# Patient Record
Sex: Male | Born: 1980 | Race: Black or African American | Hispanic: No | Marital: Single | Smoking: Never smoker
Health system: Southern US, Community
[De-identification: ages and names within clinical notes are randomized; demographics above are authoritative.]

---

## 2021-04-05 ENCOUNTER — Other Ambulatory Visit: Payer: Self-pay

## 2021-04-05 ENCOUNTER — Inpatient Hospital Stay (HOSPITAL_COMMUNITY)
Admission: EM | Admit: 2021-04-05 | Discharge: 2021-04-11 | DRG: 872 | Disposition: A | Discharge status: Home / Self Care | Payer: Medicaid Other | Attending: Internal Medicine | Admitting: Internal Medicine

## 2021-04-05 ENCOUNTER — Emergency Department (HOSPITAL_COMMUNITY): Payer: Medicaid Other

## 2021-04-05 DIAGNOSIS — R1310 Dysphagia, unspecified: Secondary | ICD-10-CM | POA: Diagnosis present

## 2021-04-05 DIAGNOSIS — D509 Iron deficiency anemia, unspecified: Secondary | ICD-10-CM | POA: Diagnosis present

## 2021-04-05 DIAGNOSIS — R221 Localized swelling, mass and lump, neck: Secondary | ICD-10-CM | POA: Diagnosis not present

## 2021-04-05 DIAGNOSIS — F1010 Alcohol abuse, uncomplicated: Secondary | ICD-10-CM | POA: Diagnosis present

## 2021-04-05 DIAGNOSIS — E871 Hypo-osmolality and hyponatremia: Secondary | ICD-10-CM | POA: Diagnosis present

## 2021-04-05 DIAGNOSIS — L040 Acute lymphadenitis of face, head and neck: Secondary | ICD-10-CM | POA: Diagnosis present

## 2021-04-05 DIAGNOSIS — L0211 Cutaneous abscess of neck: Secondary | ICD-10-CM | POA: Diagnosis present

## 2021-04-05 DIAGNOSIS — K2970 Gastritis, unspecified, without bleeding: Secondary | ICD-10-CM | POA: Diagnosis present

## 2021-04-05 DIAGNOSIS — A419 Sepsis, unspecified organism: Secondary | ICD-10-CM | POA: Diagnosis present

## 2021-04-05 DIAGNOSIS — D75838 Other thrombocytosis: Secondary | ICD-10-CM | POA: Diagnosis present

## 2021-04-05 DIAGNOSIS — Z20822 Contact with and (suspected) exposure to covid-19: Secondary | ICD-10-CM | POA: Diagnosis present

## 2021-04-05 DIAGNOSIS — R609 Edema, unspecified: Secondary | ICD-10-CM | POA: Diagnosis not present

## 2021-04-05 LAB — HIV ANTIBODY (ROUTINE TESTING W REFLEX): HIV Screen 4th Generation wRfx: NONREACTIVE

## 2021-04-05 LAB — CBC WITH DIFFERENTIAL/PLATELET
Abs Immature Granulocytes: 0 10*3/uL (ref 0.00–0.07)
Basophils Absolute: 0 10*3/uL (ref 0.0–0.1)
Basophils Relative: 0 %
Eosinophils Absolute: 0 10*3/uL (ref 0.0–0.5)
Eosinophils Relative: 0 %
HCT: 34.7 % — ABNORMAL LOW (ref 39.0–52.0)
Hemoglobin: 11.4 g/dL — ABNORMAL LOW (ref 13.0–17.0)
Lymphocytes Relative: 8 %
Lymphs Abs: 1.6 10*3/uL (ref 0.7–4.0)
MCH: 22.9 pg — ABNORMAL LOW (ref 26.0–34.0)
MCHC: 32.9 g/dL (ref 30.0–36.0)
MCV: 69.7 fL — ABNORMAL LOW (ref 80.0–100.0)
Monocytes Absolute: 1.6 10*3/uL — ABNORMAL HIGH (ref 0.1–1.0)
Monocytes Relative: 8 %
Neutro Abs: 16.9 10*3/uL — ABNORMAL HIGH (ref 1.7–7.7)
Neutrophils Relative %: 84 %
Platelets: 396 10*3/uL (ref 150–400)
RBC: 4.98 MIL/uL (ref 4.22–5.81)
RDW: 12.9 % (ref 11.5–15.5)
WBC: 20.1 10*3/uL — ABNORMAL HIGH (ref 4.0–10.5)
nRBC: 0 % (ref 0.0–0.2)
nRBC: 0 /100 WBC

## 2021-04-05 LAB — COMPREHENSIVE METABOLIC PANEL
ALT: 40 U/L (ref 0–44)
AST: 43 U/L — ABNORMAL HIGH (ref 15–41)
Albumin: 3.1 g/dL — ABNORMAL LOW (ref 3.5–5.0)
Alkaline Phosphatase: 63 U/L (ref 38–126)
Anion gap: 11 (ref 5–15)
BUN: 10 mg/dL (ref 6–20)
CO2: 25 mmol/L (ref 22–32)
Calcium: 9.2 mg/dL (ref 8.9–10.3)
Chloride: 93 mmol/L — ABNORMAL LOW (ref 98–111)
Creatinine, Ser: 1.15 mg/dL (ref 0.61–1.24)
GFR, Estimated: >60 mL/min (ref >60)
Glucose, Bld: 111 mg/dL — ABNORMAL HIGH (ref 70–99)
Potassium: 4.3 mmol/L (ref 3.5–5.1)
Sodium: 129 mmol/L — ABNORMAL LOW (ref 135–145)
Total Bilirubin: 0.8 mg/dL (ref 0.3–1.2)
Total Protein: 7.8 g/dL (ref 6.5–8.1)

## 2021-04-05 LAB — FOLATE: Folate: 12.6 ng/mL (ref >5.9)

## 2021-04-05 LAB — IRON AND TIBC
Iron: 18 ug/dL — ABNORMAL LOW (ref 45–182)
Saturation Ratios: 8 % — ABNORMAL LOW (ref 17.9–39.5)
TIBC: 239 ug/dL — ABNORMAL LOW (ref 250–450)
UIBC: 221 ug/dL

## 2021-04-05 LAB — VITAMIN B12: Vitamin B-12: 415 pg/mL (ref 180–914)

## 2021-04-05 LAB — RESP PANEL BY RT-PCR (FLU A&B, COVID) ARPGX2
Influenza A by PCR: NEGATIVE
Influenza B by PCR: NEGATIVE
SARS Coronavirus 2 by RT PCR: NEGATIVE

## 2021-04-05 LAB — FERRITIN: Ferritin: 581 ng/mL — ABNORMAL HIGH (ref 24–336)

## 2021-04-05 LAB — TSH: TSH: 2.364 u[IU]/mL (ref 0.350–4.500)

## 2021-04-05 LAB — LACTIC ACID, PLASMA: Lactic Acid, Venous: 1.2 mmol/L (ref 0.5–1.9)

## 2021-04-05 MED ORDER — FENTANYL CITRATE PF 50 MCG/ML IJ SOSY
50.0000 ug | PREFILLED_SYRINGE | Freq: Once | INTRAMUSCULAR | Status: AC
  Administered 2021-04-05: 50 ug via INTRAVENOUS
  Filled 2021-04-05: qty 1

## 2021-04-05 MED ORDER — IBUPROFEN 400 MG PO TABS: 400.0000 mg | ORAL_TABLET | Freq: Four times a day (QID) | ORAL | Status: DC | PRN

## 2021-04-05 MED ORDER — VANCOMYCIN HCL 1750 MG/350ML IV SOLN
1750.0000 mg | Freq: Once | INTRAVENOUS | Status: AC
  Administered 2021-04-05: 1750 mg via INTRAVENOUS
  Filled 2021-04-05: qty 350

## 2021-04-05 MED ORDER — LORAZEPAM 2 MG/ML IJ SOLN: 1.0000 mg | INTRAMUSCULAR | Status: AC | PRN

## 2021-04-05 MED ORDER — ADULT MULTIVITAMIN W/MINERALS CH
1.0000 | ORAL_TABLET | Freq: Every day | ORAL | Status: DC
  Administered 2021-04-05 – 2021-04-11 (×6): 1 via ORAL
  Filled 2021-04-05 (×8): qty 1

## 2021-04-05 MED ORDER — FOLIC ACID 1 MG PO TABS
1.0000 mg | ORAL_TABLET | Freq: Every day | ORAL | Status: DC
  Administered 2021-04-05 – 2021-04-11 (×7): 1 mg via ORAL
  Filled 2021-04-05 (×8): qty 1

## 2021-04-05 MED ORDER — ACETAMINOPHEN 325 MG PO TABS
650.0000 mg | ORAL_TABLET | Freq: Four times a day (QID) | ORAL | Status: DC | PRN
  Administered 2021-04-06 – 2021-04-10 (×6): 650 mg via ORAL
  Filled 2021-04-05 (×6): qty 2

## 2021-04-05 MED ORDER — LORAZEPAM 1 MG PO TABS: 1.0000 mg | ORAL_TABLET | ORAL | Status: AC | PRN

## 2021-04-05 MED ORDER — VANCOMYCIN HCL 1250 MG/250ML IV SOLN
1250.0000 mg | Freq: Two times a day (BID) | INTRAVENOUS | Status: DC
  Administered 2021-04-06 – 2021-04-07 (×3): 1250 mg via INTRAVENOUS
  Filled 2021-04-05 (×6): qty 250

## 2021-04-05 MED ORDER — IBUPROFEN 400 MG PO TABS
600.0000 mg | ORAL_TABLET | Freq: Once | ORAL | Status: AC
  Administered 2021-04-05: 600 mg via ORAL
  Filled 2021-04-05: qty 1

## 2021-04-05 MED ORDER — SODIUM CHLORIDE 0.9 % IV SOLN
1.0000 g | INTRAVENOUS | Status: DC
  Administered 2021-04-05 – 2021-04-07 (×3): 1 g via INTRAVENOUS
  Filled 2021-04-05 (×3): qty 10

## 2021-04-05 MED ORDER — THIAMINE HCL 100 MG PO TABS
100.0000 mg | ORAL_TABLET | Freq: Every day | ORAL | Status: DC
  Administered 2021-04-05 – 2021-04-11 (×7): 100 mg via ORAL
  Filled 2021-04-05 (×8): qty 1

## 2021-04-05 MED ORDER — OXYCODONE HCL 5 MG PO TABS
5.0000 mg | ORAL_TABLET | ORAL | Status: DC | PRN
  Administered 2021-04-06 (×3): 5 mg via ORAL
  Filled 2021-04-05 (×4): qty 1

## 2021-04-05 MED ORDER — IOHEXOL 300 MG/ML  SOLN
75.0000 mL | Freq: Once | INTRAMUSCULAR | Status: AC | PRN
  Administered 2021-04-05: 75 mL via INTRAVENOUS

## 2021-04-05 MED ORDER — LACTATED RINGERS IV SOLN: INTRAVENOUS | Status: DC

## 2021-04-05 NOTE — ED Triage Notes (Signed)
Pt BIB [facility] from home d/t a "fist size hematoma/bump" to the Rt side of his neck. Pt reports it appeared 1 week ago, no issues breathing or swallowing.  130/90 80 bpm 98% RA

## 2021-04-05 NOTE — ED Provider Notes (Signed)
[facility] EMERGENCY DEPARTMENT Provider Note   CSN: [id number] Arrival date & time: 04/05/21  1226     History Chief Complaint  Patient presents with   lump Rt side neck    Caeden Foots is a 40 y.o. male without significant past medical history brought in by EMS to the ED complaining of lump to his right sided neck onset 1 week.  Patient reports that he has been tolerating p.o intake without difficulty.  Patient denies recent injury or trauma.  Patient denies history of diabetes.  Denies recent bug bite.  He has associated subjective fever and redness to the area.  He has not tried any medications for his symptoms.  Patient denies trouble breathing, trouble swallowing, chest pain, abdominal pain, nausea, vomiting, fever, chills, neck pain, neck stiffness, ear pain, or sore throat  The history is provided by the patient. No language interpreter was used.      No past medical history on file.  Patient Active Problem List   Diagnosis Date Noted   Neck abscess 04/05/2021   Sepsis (HCC) 04/05/2021   Hyponatremia 04/05/2021   Microcytic anemia 04/05/2021    No family history on file.     Home Medications Prior to Admission medications   Not on File    Allergies    Patient has no allergy information on record.  Review of Systems   Review of Systems  Constitutional:  Positive for fever. Negative for chills.  HENT:  Negative for congestion, facial swelling, rhinorrhea, sneezing, sore throat and trouble swallowing.   Respiratory:  Negative for cough and shortness of breath.   Cardiovascular:  Negative for chest pain.  Gastrointestinal:  Negative for abdominal pain, nausea and vomiting.  Musculoskeletal:  Negative for neck pain and neck stiffness.  Skin:  Positive for color change. Negative for rash.       + Lump to right sided neck  All other systems reviewed and are negative.  Physical Exam Updated Vital Signs BP 118/62 (BP Location: Right Arm)    Pulse 93   Temp 100.3 F (37.9 C) (Oral)   Resp 18   Ht 6\' 2"  (1.88 m)   Wt 86.2 kg   SpO2 100%   BMI 24.39 kg/m   Physical Exam Vitals and nursing note reviewed.  Constitutional:      General: He is not in acute distress.    Appearance: He is not diaphoretic.  HENT:     Head: Normocephalic and atraumatic.     Jaw: No trismus.     Comments: Able to phonate without difficulty.    Nose: Nose normal.     Mouth/Throat:     Mouth: Mucous membranes are moist.     Pharynx: Oropharynx is clear. Uvula midline. No oropharyngeal exudate.     Tonsils: No tonsillar abscesses.     Comments: Uvula midline.  No oropharynx erythema or exudate. No tonsillar abscess appreciated.  Eyes:     General: No scleral icterus.    Conjunctiva/sclera: Conjunctivae normal.  Neck:     Comments: Large mass noted to right lateral neck with overlying erythema and warmth.  Difficulty with passive range of motion to right side due to lump.  Patient able to turn head to the right about 45 degrees.  Patient able to flex and extend and look to the left side without difficulty.  No appreciated cervical lymphadenopathy.  No cervical spinous tenderness.  See picture below. Cardiovascular:     Rate and Rhythm: Normal  rate and regular rhythm.     Pulses: Normal pulses.     Heart sounds: Normal heart sounds.  Pulmonary:     Effort: Pulmonary effort is normal. No respiratory distress.     Breath sounds: Normal breath sounds. No wheezing.  Abdominal:     General: Bowel sounds are normal.     Palpations: Abdomen is soft. There is no mass.     Tenderness: There is no abdominal tenderness. There is no guarding or rebound.  Musculoskeletal:     Cervical back: Neck supple. Erythema present. No signs of trauma or rigidity. No spinous process tenderness or muscular tenderness. Decreased range of motion.  Lymphadenopathy:     Cervical: No cervical adenopathy.  Skin:    General: Skin is warm and dry.  Neurological:      Mental Status: He is alert.  Psychiatric:        Behavior: Behavior normal.        ED Results / Procedures / Treatments   Labs (all labs ordered are listed, but only abnormal results are displayed) Labs Reviewed  COMPREHENSIVE METABOLIC PANEL - Abnormal; Notable for the following components:      Result Value   Sodium 129 (*)    Chloride 93 (*)    Glucose, Bld 111 (*)    Albumin 3.1 (*)    AST 43 (*)    All other components within normal limits  CBC WITH DIFFERENTIAL/PLATELET - Abnormal; Notable for the following components:   WBC 20.1 (*)    Hemoglobin 11.4 (*)    HCT 34.7 (*)    MCV 69.7 (*)    MCH 22.9 (*)    Neutro Abs 16.9 (*)    Monocytes Absolute 1.6 (*)    All other components within normal limits  RESP PANEL BY RT-PCR (FLU A&B, COVID) ARPGX2  CULTURE, BLOOD (ROUTINE X 2)  CULTURE, BLOOD (ROUTINE X 2)  LACTIC ACID, PLASMA    EKG None  Radiology CT Soft Tissue Neck W Contrast  Result Date: 04/05/2021 CLINICAL DATA:  Right neck mass for 1 week.  Fever and leukocytosis. EXAM: CT NECK WITH CONTRAST TECHNIQUE: Multidetector CT imaging of the neck was performed using the standard protocol following the bolus administration of intravenous contrast. CONTRAST:  63mL OMNIPAQUE IOHEXOL 300 MG/ML  SOLN COMPARISON:  None. FINDINGS: Pharynx and larynx: No mass. Calcification along the anterior aspect of the right palatine tonsil. No retropharyngeal fluid collection. Widely patent airway. Salivary glands: Mild mass effect on the right parotid and right submandibular glands by the large right neck mass/collection described below which is not felt to be of primary salivary origin. Unremarkable appearance of the left submandibular and left parotid glands. Thyroid: Subcentimeter calcification in the left thyroid lobe for which no imaging follow-up is recommended. Lymph nodes: Subcentimeter short axis lymph nodes in the neck bilaterally. Vascular: Nonvisualization of the right internal  jugular vein in the mid and upper neck, either completely effaced or occluded. Limited intracranial: Unremarkable. Visualized orbits: Unremarkable. Mastoids and visualized paranasal sinuses: Minimal mucosal thickening in the maxillary sinuses. Clear mastoid air cells. Skeleton: Scattered dental caries and periapical lucencies. Moderate disc degeneration at C5-6. Upper chest: Clear lung apices. Other: A predominantly low-density mass/collection in the lateral right upper neck with thick, irregular peripheral enhancement measures 5.5 x 5.6 x 7.4 cm. The mass is inseparable from the right sternocleidomastoid muscle, and there is deep extension towards the parapharyngeal space. There is extensive surrounding inflammatory stranding which extends into the lower  neck. IMPRESSION: 7.4 cm right upper neck mass with extensive surrounding inflammation most consistent with an abscess with this clinical history. Necrotic tumor is an alternative consideration. Electronically Signed   By: Sebastian Ache M.D.   On: 04/05/2021 16:26    Procedures Procedures   Medications Ordered in ED Medications  cefTRIAXone (ROCEPHIN) 1 g in sodium chloride 0.9 % 100 mL IVPB (0 g Intravenous Stopped 04/05/21 1516)  vancomycin (VANCOREADY) IVPB 1250 mg/250 mL (has no administration in time range)  ibuprofen (ADVIL) tablet 600 mg (600 mg Oral Given 04/05/21 1430)  vancomycin (VANCOREADY) IVPB 1750 mg/350 mL (0 mg Intravenous Stopped 04/05/21 1645)  iohexol (OMNIPAQUE) 300 MG/ML solution 75 mL (75 mLs Intravenous Contrast Given 04/05/21 1548)  fentaNYL (SUBLIMAZE) injection 50 mcg (50 mcg Intravenous Given 04/05/21 1718)    ED Course  I have reviewed the triage vital signs and the nursing notes.  Pertinent labs & imaging results that were available during my care of the patient were reviewed by me and considered in my medical decision making (see chart for details).  Clinical Course as of 04/05/21 1853  Thu Apr 05, 2021  1256  Attending in to evaluate patient. Bedside US completed by Attending with complex materials noted. Discussed with patient will obtain CT neck and labs, patient agreeable. [SB]  1352 Patient reevaluated.  Patient asleep resting comfortably on stretcher.  Notified patient lab results and informed that we will start IV antibiotics in the ED.  Patient agreeable at this time. [SB]  1413 Repeat vital signs showed elevated temperature to 102.4.  Ibuprofen ordered. Blood cultures and lactate ordered [SB]  1422 Patient asleep on stretcher upon reevaluation.  Patient updated with treatment plan. Patient agreeable. [SB]  1700 Patient temperature improved with Ibuprofen.  [SB]  1700 Patient requesting pain medications, will order. [SB]  4431 Consult with ENT specialist, Dr. Suszanne Conners who recommends admission for IV antibiotics.  He will evaluate patient in the morning.  [SB]  1843 Consult with hospitalist, Dr. Artis Flock who agrees to admit patient at this time. [SB]    Clinical Course User Index [SB] Rashee Marschall A, PA-C   MDM Rules/Calculators/A&P                         Patient presents to the ED with lump to right side of neck x1 week.  Denies recent trauma, injury, bug bite, or history of diabetes.  Upon arrival to the ED, patient tolerating secretions and noted that he drank water while waiting.  No drooling on exam.  Oxygen saturation at 100%, patent airway, no concern for airway compromise at this time. On exam patient with large mass noted to right lateral neck with overlying erythema and warmth.  Difficulty with passive range of motion to right, patient able to turn head to the right about 45 degrees.  Normal flexion and extension and lateral left without difficulty.  No cervical spinous tenderness.  Otherwise no acute cardiovascular, respiratory, abdominal exam findings.  Vital signs stable, patient afebrile, oxygen saturation 100%.  Differential diagnosis includes abscess, lipoma, or brachial cleft cyst.   Vancomycin and Rocephin started in the ED.  Patient given a dose of fentanyl for pain.   CBC notable for a WBC elevated to 20.1 otherwise unremarkable. CMP with sodium 129 otherwise unremarkable.  CT soft tissue neck showed 7.4 cm mass consistent with abscess.  Patient with elevated temperature on recheck to 102.4, ibuprofen given in the ED.  Blood cultures ordered  and pending.  Lactate obtained and normal.  This is likely abscess of right lateral neck.   Consult with ENT, Dr. Suszanne Conners who recommended admission for IV antibiotics.  He will evaluate patient in the morning.  Consult with hospitalist, Dr. Sheppard Penton who agrees to admit patient at this time.  Patient notified and agreeable to admission.  Attending evaluated patient and agrees with admission and treatment plan.  Final Clinical Impression(s) / ED Diagnoses Final diagnoses:  Mass of lateral neck    Rx / DC Orders ED Discharge Orders     None        Joelle Roswell A, PA-C 04/05/21 Lowell Guitar, MD 04/06/21 [phone]

## 2021-04-05 NOTE — H&P (Signed)
History and Physical    Ernest Jenkins GYJ:[id number] DOB: 09-28-1980 DOA: 04/05/2021  PCP: Default, Provider, MD Consultants:  none Patient coming from:  Home - lives with wife and 9 kids.   Chief Complaint: right neck mass   HPI: Ernest Jenkins is a 40 y.o. male with no medical history who presents to ED with right lateral neck mass that started last Friday. He states he started to feel bad and noticed when he turned his head to the right he had pain, but nothing was there. When he woke up on Saturday he had a large mass on the right side of his neck. Since Saturday has continued to get bigger. Warm to touch and red.  No problems breathing or swallowing, but states he can feel food go down. He has had chills, subjective fever. He denies any trauma to the neck, bite, tooth infection. No history of MRSA infection that he is aware of.   He does drink 1 beer (40 ounce) daily. He drinks 8 beers or so a day on the weekend. No history of withdrawals. He does not smoke tobacco. Uses MJ. No other drug use.   Denies any headaches, vision changes, chest pain, palpitations, shortness of breath, cough, stomach pain, N/V/D, leg swelling, dysuria. Poor pO intake over past week.   ED Course: vitals: temp: 100.3, blood pressure 118/62, heart rate 93, respiratory rate 18, oxygen 100% on room air. Pertinent labs: WBC 20.1, hemoglobin 11.4, MCV 69.7, sodium 129, albumin 3.1, AST 43, Actiq acid 1.2, COVID and flu negative. CT soft tissue neck shows predominantly low-density mass collection in the lateral right upper neck with thick, irregular peripheral enhancement measures 5.5 x 5.6 x 7.4 cm.  Mass is inseparable from the right sternocleidomastoid muscle and there is deep extension towards the  parapharyngeal space.  Surrounding inflammatory stranding which extends into the lower neck. In ED patient was started on vancomycin and Rocephin, ENT was consulted and [facility] was asked to admit.  Review of Systems: As per HPI;  otherwise review of systems reviewed and negative.   Ambulatory Status:  Ambulates without assistance   No past medical history  Surgical  history: none     Social History   Socioeconomic History   Marital status: Single    Spouse name: Not on file   Number of children: Not on file   Years of education: Not on file   Highest education level: Not on file  Occupational History   Not on file  Tobacco Use   Smoking status: Not on file   Smokeless tobacco: Not on file  Substance and Sexual Activity   Alcohol use: Not on file   Drug use: Not on file   Sexual activity: Not on file  Other Topics Concern   Not on file  Social History Narrative   Not on file   Social Determinants of Health   Financial Resource Strain: Not on file  Food Insecurity: Not on file  Transportation Needs: Not on file  Physical Activity: Not on file  Stress: Not on file  Social Connections: Not on file  Intimate Partner Violence: Not on file    Not on File  No family history on file.  Prior to Admission medications   Not on File    Physical Exam: Vitals:   04/05/21 1700 04/05/21 1730 04/05/21 1800 04/05/21 1830  BP: 119/69 117/75 116/69 124/68  Pulse: 70 70 69 76  Resp: (!) 27 (!) 21 17 20   Temp:  TempSrc:      SpO2: 100% 97% 98% 100%  Weight:      Height:         General:  Appears calm and comfortable and is in NAD Eyes:  PERRL, EOMI, normal lids, iris ENT:  grossly normal hearing, lips & tongue, mmm; poor dentition with some bad teeth, but no obvious tooth abscess. Right TM not fully visualized due to cerumen.  Neck:  no LAD,  no thyromegaly; no carotid bruits. Large right sided abscess that is warm and indurated.      Cardiovascular:  RRR, no m/r/g. No LE edema.  Respiratory:   CTA bilaterally with no wheezes/rales/rhonchi.  Normal respiratory effort. Abdomen:  soft, NT, ND, NABS Back:   normal alignment, no CVAT Skin:  no rash or induration seen on limited  exam Musculoskeletal:  grossly normal tone BUE/BLE, good ROM, no bony abnormality Lower extremity:  No LE edema.  Limited foot exam with no ulcerations.  2+ distal pulses. Psychiatric:  grossly normal mood and affect, speech fluent and appropriate, AOx3 Neurologic:  CN 2-12 grossly intact, moves all extremities in coordinated fashion, sensation intact    Radiological Exams on Admission: Independently reviewed - see discussion in A/P where applicable  CT Soft Tissue Neck W Contrast  Result Date: 04/05/2021 CLINICAL DATA:  Right neck mass for 1 week.  Fever and leukocytosis. EXAM: CT NECK WITH CONTRAST TECHNIQUE: Multidetector CT imaging of the neck was performed using the standard protocol following the bolus administration of intravenous contrast. CONTRAST:  46mL OMNIPAQUE IOHEXOL 300 MG/ML  SOLN COMPARISON:  None. FINDINGS: Pharynx and larynx: No mass. Calcification along the anterior aspect of the right palatine tonsil. No retropharyngeal fluid collection. Widely patent airway. Salivary glands: Mild mass effect on the right parotid and right submandibular glands by the large right neck mass/collection described below which is not felt to be of primary salivary origin. Unremarkable appearance of the left submandibular and left parotid glands. Thyroid: Subcentimeter calcification in the left thyroid lobe for which no imaging follow-up is recommended. Lymph nodes: Subcentimeter short axis lymph nodes in the neck bilaterally. Vascular: Nonvisualization of the right internal jugular vein in the mid and upper neck, either completely effaced or occluded. Limited intracranial: Unremarkable. Visualized orbits: Unremarkable. Mastoids and visualized paranasal sinuses: Minimal mucosal thickening in the maxillary sinuses. Clear mastoid air cells. Skeleton: Scattered dental caries and periapical lucencies. Moderate disc degeneration at C5-6. Upper chest: Clear lung apices. Other: A predominantly low-density  mass/collection in the lateral right upper neck with thick, irregular peripheral enhancement measures 5.5 x 5.6 x 7.4 cm. The mass is inseparable from the right sternocleidomastoid muscle, and there is deep extension towards the parapharyngeal space. There is extensive surrounding inflammatory stranding which extends into the lower neck. IMPRESSION: 7.4 cm right upper neck mass with extensive surrounding inflammation most consistent with an abscess with this clinical history. Necrotic tumor is an alternative consideration. Electronically Signed   By: Sebastian Ache M.D.   On: 04/05/2021 16:26     Labs on Admission: I have personally reviewed the available labs and imaging studies at the time of the admission.  Pertinent labs:  WBC 20.1,  hemoglobin 11.4,  MCV 69.7,  sodium 129,  albumin 3.1,  AST 43,  lactic acid 1.2, COVID and flu negative.   Assessment/Plan Principal Problem:   Neck abscess with sepsis criteria  -40 year old male presenting with right neck abscess and sepsis criteria with elevated WBC, tachycardia and fever.  -place  in obs with IV antibiotics. Continue vancomycin and rocephin for now -TM not fully visualized and no obvious tooth infection or open wound to skin.  -ENT consulted and will see tomorrow -no obstructing airway and no difficulties with swallowing  -will place SCDs and make NPO at midnight in case of any procedure  -tylenol for fever -IVF  -lactic acid wnl -blood cx pending   Active Problems:     Hyponatremia -? If beer potomania with some hypovolemia aspect as poor PO intake x 1 week.  -check urine studies -gentle IVF overnight  -encouraged to decrease beer intake -follow in AM    Microcytic anemia  -no family hx of sickle cell or other blood disorders -check iron studies   Alcohol abuse No hx of withdrawals Place on CIWA protocol  Start MV/thiamine/folic acid  Encouraged to decrease alcohol intake    Body mass index is 24.39  kg/m.   Level of care: Med-Surg DVT prophylaxis:  SCDs Code Status:  Full - confirmed with patient Family Communication: None present Disposition Plan:  The patient is from: home  Anticipated d/c is to: home   Placed in observation as anticipate less than 2 midnight stay. Needs hospitalization for IV antibiotics, IV fluids and MDM with specialities with possible intervention of neck abscess   Patient is currently: stable  Consults called: ENT  Admission status:  observation   Dragon dictation used in completing this note.    Orland Mustard MD [facility]   How to contact the [facility] Attending or Consulting provider 7A - 7P or covering provider during after hours 7P -7A, for this patient?  Check the care team in [facility] and look for a) attending/consulting [facility] provider listed and b) the [facility] team listed Log into [email or web address] and use [facility]'s universal password to access. If you do not have the password, please contact the hospital operator. Locate the [facility] provider you are looking for under [facility] and page to a number that you can be directly reached. If you still have difficulty reaching the provider, please page the [facility] (Director on Call) for the Hospitalists listed on amion for assistance.   04/05/2021, 6:48 PM

## 2021-04-05 NOTE — ED Notes (Signed)
Provider notified of pt request of pain meds.

## 2021-04-05 NOTE — Progress Notes (Signed)
Pharmacy Antibiotic Note  Ernest Jenkins is a 40 y.o. male admitted on 04/05/2021 with cellulitis.  Pharmacy has been consulted for vancomycin dosing.  Presenting to bump on R side of neck - WBC 20, Tmax 102. Scr 1.15 (CrCl 97 mL/min).   Plan: Vancomycin 1750 mg IV once then 1250 mg IV every 12 hours (estAUC 473) Monitor renal fx, cx results, clinical pic, and vanc levels prn  Height: 6\' 2"  (188 cm) Weight: 86.2 kg (190 lb) IBW/kg (Calculated) : 82.2  Temp (24hrs), Avg:101.4 F (38.6 C), Min:100.3 F (37.9 C), Max:102.4 F (39.1 C)  Recent Labs  Lab 04/05/21 1245  WBC 20.1*  CREATININE 1.15    Estimated Creatinine Clearance: 97.3 mL/min (by C-G formula based on SCr of 1.15 mg/dL).    Not on File  Antimicrobials this admission: Vancomycin 11/24 >>  Ceftriaxone 11/24 >>   Dose adjustments this admission: N/A   Microbiology results: 11/24 BCx: sent 11/24 COVID/Influenza PCR: sent   Thank you for allowing pharmacy to be a part of this patient's care.  12/24, PharmD, BCCCP Clinical Pharmacist  Phone: [phone] 04/05/2021 2:27 PM  Please check AMION for all [facility] Pharmacy phone numbers After 10:00 PM, call Main Pharmacy [phone]

## 2021-04-06 ENCOUNTER — Encounter (HOSPITAL_COMMUNITY): Payer: Self-pay | Admitting: Internal Medicine

## 2021-04-06 DIAGNOSIS — A419 Sepsis, unspecified organism: Secondary | ICD-10-CM | POA: Diagnosis not present

## 2021-04-06 DIAGNOSIS — K2970 Gastritis, unspecified, without bleeding: Secondary | ICD-10-CM | POA: Diagnosis not present

## 2021-04-06 DIAGNOSIS — D75838 Other thrombocytosis: Secondary | ICD-10-CM | POA: Diagnosis not present

## 2021-04-06 DIAGNOSIS — R221 Localized swelling, mass and lump, neck: Secondary | ICD-10-CM | POA: Diagnosis not present

## 2021-04-06 DIAGNOSIS — E871 Hypo-osmolality and hyponatremia: Secondary | ICD-10-CM

## 2021-04-06 DIAGNOSIS — D509 Iron deficiency anemia, unspecified: Secondary | ICD-10-CM | POA: Diagnosis not present

## 2021-04-06 DIAGNOSIS — Z20822 Contact with and (suspected) exposure to covid-19: Secondary | ICD-10-CM | POA: Diagnosis not present

## 2021-04-06 DIAGNOSIS — F1010 Alcohol abuse, uncomplicated: Secondary | ICD-10-CM

## 2021-04-06 DIAGNOSIS — R1310 Dysphagia, unspecified: Secondary | ICD-10-CM | POA: Diagnosis not present

## 2021-04-06 DIAGNOSIS — L040 Acute lymphadenitis of face, head and neck: Secondary | ICD-10-CM | POA: Diagnosis not present

## 2021-04-06 DIAGNOSIS — L0211 Cutaneous abscess of neck: Secondary | ICD-10-CM | POA: Diagnosis not present

## 2021-04-06 LAB — BASIC METABOLIC PANEL
Anion gap: 9 (ref 5–15)
BUN: 10 mg/dL (ref 6–20)
CO2: 24 mmol/L (ref 22–32)
Calcium: 8.6 mg/dL — ABNORMAL LOW (ref 8.9–10.3)
Chloride: 96 mmol/L — ABNORMAL LOW (ref 98–111)
Creatinine, Ser: 1.05 mg/dL (ref 0.61–1.24)
GFR, Estimated: >60 mL/min (ref >60)
Glucose, Bld: 110 mg/dL — ABNORMAL HIGH (ref 70–99)
Potassium: 3.8 mmol/L (ref 3.5–5.1)
Sodium: 129 mmol/L — ABNORMAL LOW (ref 135–145)

## 2021-04-06 LAB — CBC
HCT: 31 % — ABNORMAL LOW (ref 39.0–52.0)
Hemoglobin: 10.2 g/dL — ABNORMAL LOW (ref 13.0–17.0)
MCH: 23 pg — ABNORMAL LOW (ref 26.0–34.0)
MCHC: 32.9 g/dL (ref 30.0–36.0)
MCV: 70 fL — ABNORMAL LOW (ref 80.0–100.0)
Platelets: 425 10*3/uL — ABNORMAL HIGH (ref 150–400)
RBC: 4.43 MIL/uL (ref 4.22–5.81)
RDW: 12.8 % (ref 11.5–15.5)
WBC: 19.5 10*3/uL — ABNORMAL HIGH (ref 4.0–10.5)
nRBC: 0 % (ref 0.0–0.2)

## 2021-04-06 LAB — OSMOLALITY, URINE: Osmolality, Ur: 750 mOsm/kg (ref 300–900)

## 2021-04-06 LAB — SODIUM, URINE, RANDOM: Sodium, Ur: 50 mmol/L

## 2021-04-06 MED ORDER — FAMOTIDINE 20 MG PO TABS
20.0000 mg | ORAL_TABLET | Freq: Every day | ORAL | Status: DC
  Administered 2021-04-07 – 2021-04-11 (×5): 20 mg via ORAL
  Filled 2021-04-06 (×6): qty 1

## 2021-04-06 MED ORDER — SODIUM CHLORIDE 0.9 % IV BOLUS
1000.0000 mL | Freq: Once | INTRAVENOUS | Status: AC
  Administered 2021-04-06: 1000 mL via INTRAVENOUS

## 2021-04-06 MED ORDER — KETOROLAC TROMETHAMINE 15 MG/ML IJ SOLN: 15.0000 mg | Freq: Four times a day (QID) | INTRAMUSCULAR | Status: DC | PRN

## 2021-04-06 MED ORDER — OXYCODONE HCL 5 MG PO TABS
5.0000 mg | ORAL_TABLET | ORAL | Status: DC | PRN
  Administered 2021-04-07 – 2021-04-11 (×11): 5 mg via ORAL
  Filled 2021-04-06 (×11): qty 1

## 2021-04-06 MED ORDER — IBUPROFEN 400 MG PO TABS: 400.0000 mg | ORAL_TABLET | Freq: Four times a day (QID) | ORAL | Status: DC | PRN

## 2021-04-06 MED ORDER — FERROUS SULFATE 325 (65 FE) MG PO TABS
325.0000 mg | ORAL_TABLET | Freq: Every day | ORAL | Status: DC
  Administered 2021-04-07 – 2021-04-11 (×5): 325 mg via ORAL
  Filled 2021-04-06 (×6): qty 1

## 2021-04-06 NOTE — Progress Notes (Addendum)
NEW ADMISSION NOTE New Admission Note:   Arrival Method: stretcher Mental Orientation: A&O X4 Telemetry:none Assessment: Completed Skin: intact IV: RFA LFA Pain:  8/10 Tubes: none Safety Measures: Safety Fall Prevention Plan has been given, discussed and signed Admission: Completed [facility] Orientation: Patient has been orientated to the room, unit and staff.  Family: wife  Orders have been reviewed and implemented. Will continue to monitor the patient. Call light has been placed within reach and bed alarm has been activated.   Jamicheal Heard S Maja Mccaffery, RN

## 2021-04-06 NOTE — H&P (View-Only) (Signed)
Reason for Consult: RIght neck abscess  HPI:  Ernest Jenkins is an 40 y.o. male who presented to the ER yesterday complaining of right neck swelling and pain. The patient first noted the symptoms 1 week ago. The onset was acute. Patient reports that he has been tolerating p.o intake without difficulty.  Patient denies recent injury or trauma.  Patient denies history of diabetes.  Denies recent bug bite.  He has associated subjective fever and redness to the area.  He has not tried any medications for his symptoms.  Patient denies trouble breathing, dental pain, trouble swallowing, chest pain, abdominal pain, nausea, vomiting, fever, chills, neck pain, ear pain, or sore throat  No past medical history on file.  No family history on file.  Social History:  has no history on file for tobacco use, alcohol use, and drug use.  Allergies: No Known Allergies  Prior to Admission medications   Not on File    Medications: I have reviewed the patient's current medications. Scheduled:  folic acid  1 mg Oral Daily   multivitamin with minerals  1 tablet Oral Daily   thiamine  100 mg Oral Daily   Continuous:  cefTRIAXone (ROCEPHIN)  IV Stopped (04/05/21 1516)   lactated ringers 75 mL/hr at 04/06/21 0610   vancomycin Stopped (04/06/21 0400)   HUD:JSHFWYOVZCHYI, LORazepam **OR** LORazepam, oxyCODONE  Results for orders placed or performed during the hospital encounter of 04/05/21 (from the past 48 hour(s))  Comprehensive metabolic panel     Status: Abnormal   Collection Time: 04/05/21 12:45 PM  Result Value Ref Range   Sodium 129 (L) 135 - 145 mmol/L   Potassium 4.3 3.5 - 5.1 mmol/L   Chloride 93 (L) 98 - 111 mmol/L   CO2 25 22 - 32 mmol/L   Glucose, Bld 111 (H) 70 - 99 mg/dL    Comment: Glucose reference range applies only to samples taken after fasting for at least 8 hours.   BUN 10 6 - 20 mg/dL   Creatinine, Ser 5.02 0.61 - 1.24 mg/dL   Calcium 9.2 8.9 - 77.4 mg/dL   Total Protein 7.8 6.5 -  8.1 g/dL   Albumin 3.1 (L) 3.5 - 5.0 g/dL   AST 43 (H) 15 - 41 U/L   ALT 40 0 - 44 U/L   Alkaline Phosphatase 63 38 - 126 U/L   Total Bilirubin 0.8 0.3 - 1.2 mg/dL   GFR, Estimated >12 >87 mL/min    Comment: (NOTE) Calculated using the CKD-EPI Creatinine Equation (2021)    Anion gap 11 5 - 15    Comment: Performed at [facility], [address], [place], [place] [address]  CBC with Differential     Status: Abnormal   Collection Time: 04/05/21 12:45 PM  Result Value Ref Range   WBC 20.1 (H) 4.0 - 10.5 K/uL   RBC 4.98 4.22 - 5.81 MIL/uL   Hemoglobin 11.4 (L) 13.0 - 17.0 g/dL   HCT 20.9 (L) 47.0 - 96.2 %   MCV 69.7 (L) 80.0 - 100.0 fL    Comment: REPEATED TO VERIFY   MCH 22.9 (L) 26.0 - 34.0 pg   MCHC 32.9 30.0 - 36.0 g/dL   RDW 83.6 62.9 - 47.6 %   Platelets 396 150 - 400 K/uL    Comment: REPEATED TO VERIFY   nRBC 0.0 0.0 - 0.2 %   Neutrophils Relative % 84 %   Neutro Abs 16.9 (H) 1.7 - 7.7 K/uL   Lymphocytes  Relative 8 %   Lymphs Abs 1.6 0.7 - 4.0 K/uL   Monocytes Relative 8 %   Monocytes Absolute 1.6 (H) 0.1 - 1.0 K/uL   Eosinophils Relative 0 %   Eosinophils Absolute 0.0 0.0 - 0.5 K/uL   Basophils Relative 0 %   Basophils Absolute 0.0 0.0 - 0.1 K/uL   WBC Morphology See Note     Comment: Increased Bands. >20% Bands  Toxic Granulation  Vaculated Neutrophils    nRBC 0 0 /100 WBC   Abs Immature Granulocytes 0.00 0.00 - 0.07 K/uL    Comment: Performed at [facility], [address], [place], [place] [address]  Resp Panel by RT-PCR (Flu A&B, Covid) Nasopharyngeal Swab     Status: None   Collection Time: 04/05/21  1:55 PM   Specimen: Nasopharyngeal Swab; Nasopharyngeal(NP) swabs in vial transport medium  Result Value Ref Range   SARS Coronavirus 2 by RT PCR NEGATIVE NEGATIVE    Comment: (NOTE) SARS-CoV-2 target nucleic acids are NOT DETECTED.  The SARS-CoV-2 RNA is generally detectable in upper respiratory specimens during the acute phase of infection.  The lowest concentration of SARS-CoV-2 viral copies this assay can detect is 138 copies/mL. A negative result does not preclude SARS-Cov-2 infection and should not be used as the sole basis for treatment or other patient management decisions. A negative result may occur with  improper specimen collection/handling, submission of specimen other than nasopharyngeal swab, presence of viral mutation(s) within the areas targeted by this assay, and inadequate number of viral copies(<138 copies/mL). A negative result must be combined with clinical observations, patient history, and epidemiological information. The expected result is Negative.  Fact Sheet for Patients:  BloggerCourse.com  Fact Sheet for Healthcare Providers:  SeriousBroker.it  This test is no t yet approved or cleared by the [place] FDA and  has been authorized for detection and/or diagnosis of SARS-CoV-2 by FDA under an Emergency Use Authorization (EUA). This EUA will remain  in effect (meaning this test can be used) for the duration of the COVID-19 declaration under Section 564(b)(1) of the Act, 21 U.S.C.section 360bbb-3(b)(1), unless the authorization is terminated  or revoked sooner.       Influenza A by PCR NEGATIVE NEGATIVE   Influenza B by PCR NEGATIVE NEGATIVE    Comment: (NOTE) The Xpert Xpress SARS-CoV-2/FLU/RSV plus assay is intended as an aid in the diagnosis of influenza from Nasopharyngeal swab specimens and should not be used as a sole basis for treatment. Nasal washings and aspirates are unacceptable for Xpert Xpress SARS-CoV-2/FLU/RSV testing.  Fact Sheet for Patients: BloggerCourse.com  Fact Sheet for Healthcare Providers: SeriousBroker.it  This test is not yet approved or cleared by the [place] FDA and has been authorized for detection and/or diagnosis of SARS-CoV-2 by FDA under an  Emergency Use Authorization (EUA). This EUA will remain in effect (meaning this test can be used) for the duration of the COVID-19 declaration under Section 564(b)(1) of the Act, 21 U.S.C. section 360bbb-3(b)(1), unless the authorization is terminated or revoked.  Performed at [facility], [address], [place], [place] [address]   Lactic acid, plasma     Status: None   Collection Time: 04/05/21  2:11 PM  Result Value Ref Range   Lactic Acid, Venous 1.2 0.5 - 1.9 mmol/L    Comment: Performed at [facility], [address] [place], [place] [address]  TSH     Status: None   Collection Time: 04/05/21  7:46 PM  Result Value Ref Range   TSH 2.364 0.350 - 4.500 uIU/mL    Comment: Performed by a 3rd Generation assay with a functional sensitivity of <=0.01 uIU/mL. Performed at [facility], [address] [place], [place] [address]   Vitamin B12     Status: None   Collection Time: 04/05/21  7:53 PM  Result Value Ref Range   Vitamin B-12 415 180 - 914 pg/mL    Comment: (NOTE) This assay is not validated for testing neonatal or myeloproliferative syndrome specimens for Vitamin B12 levels. Performed at [facility], [address] [place], [place] [address]   Folate, serum, performed at [facility] lab     Status: None   Collection Time: 04/05/21  7:53 PM  Result Value Ref Range   Folate 12.6 >5.9 ng/mL    Comment: Performed at [facility], [address] [place], [place] [address]  Iron and TIBC     Status: Abnormal   Collection Time: 04/05/21  7:53 PM  Result Value Ref Range   Iron 18 (L) 45 - 182 ug/dL   TIBC 229 (L) 798 - 921 ug/dL   Saturation Ratios 8 (L) 17.9 - 39.5 %   UIBC 221 ug/dL    Comment: Performed at [facility], [address], [place], [place] [address]  Ferritin     Status: Abnormal   Collection Time: 04/05/21  7:53 PM  Result Value Ref Range   Ferritin 581 (H) 24 - 336 ng/mL    Comment: Performed at [facility], [address] [place], [place] [address]  HIV Antibody (routine testing w rflx)     Status: None   Collection Time: 04/05/21  7:53 PM  Result Value Ref Range   HIV Screen 4th Generation wRfx Non Reactive Non Reactive    Comment: Performed at [facility], [address] [place], [place] [address]  Sodium, urine, random     Status: None   Collection Time: 04/06/21  1:48 AM  Result Value Ref Range   Sodium, Ur 50 mmol/L    Comment: Performed at [facility], [address], [place], [place] [address]  Osmolality, urine     Status: None   Collection Time: 04/06/21  1:48 AM  Result Value Ref Range   Osmolality, Ur 750 300 - 900 mOsm/kg    Comment: Performed at [facility], [address] [place], [place] [address]  Basic metabolic panel     Status: Abnormal   Collection Time: 04/06/21  4:00 AM  Result Value Ref Range   Sodium 129 (L) 135 - 145 mmol/L   Potassium 3.8 3.5 - 5.1 mmol/L   Chloride 96 (L) 98 - 111 mmol/L   CO2 24 22 - 32 mmol/L   Glucose, Bld 110 (H) 70 - 99 mg/dL    Comment: Glucose reference range applies only to samples taken after fasting for at least 8 hours.   BUN 10 6 - 20 mg/dL   Creatinine, Ser 7.85 0.61 - 1.24 mg/dL   Calcium 8.6 (L) 8.9 - 10.3 mg/dL   GFR, Estimated >88 >50 mL/min    Comment: (NOTE) Calculated using the CKD-EPI Creatinine Equation (2021)    Anion gap 9 5 - 15    Comment: Performed at [facility], [address] [place], [place] [address]  CBC     Status: Abnormal   Collection Time:  04/06/21  4:00 AM  Result Value Ref Range   WBC 19.5 (H) 4.0 - 10.5 K/uL   RBC 4.43 4.22 - 5.81 MIL/uL   Hemoglobin 10.2 (L) 13.0 - 17.0 g/dL   HCT 42.1 (L) 03.1 - 28.1 %   MCV 70.0 (L) 80.0 - 100.0 fL   MCH 23.0 (L) 26.0 - 34.0 pg   MCHC 32.9 30.0 - 36.0 g/dL   RDW 18.8 67.7 - 37.3 %   Platelets 425 (H) 150 - 400 K/uL   nRBC 0.0 0.0 - 0.2 %    Comment: Performed at [facility], [address]  [place], [place] [address]    CT Soft Tissue Neck W Contrast  Result Date: 04/05/2021 CLINICAL DATA:  Right neck mass for 1 week.  Fever and leukocytosis. EXAM: CT NECK WITH CONTRAST TECHNIQUE: Multidetector CT imaging of the neck was performed using the standard protocol following the bolus administration of intravenous contrast. CONTRAST:  64mL OMNIPAQUE IOHEXOL 300 MG/ML  SOLN COMPARISON:  None. FINDINGS: Pharynx and larynx: No mass. Calcification along the anterior aspect of the right palatine tonsil. No retropharyngeal fluid collection. Widely patent airway. Salivary glands: Mild mass effect on the right parotid and right submandibular glands by the large right neck mass/collection described below which is not felt to be of primary salivary origin. Unremarkable appearance of the left submandibular and left parotid glands. Thyroid: Subcentimeter calcification in the left thyroid lobe for which no imaging follow-up is recommended. Lymph nodes: Subcentimeter short axis lymph nodes in the neck bilaterally. Vascular: Nonvisualization of the right internal jugular vein in the mid and upper neck, either completely effaced or occluded. Limited intracranial: Unremarkable. Visualized orbits: Unremarkable. Mastoids and visualized paranasal sinuses: Minimal mucosal thickening in the maxillary sinuses. Clear mastoid air cells. Skeleton: Scattered dental caries and periapical lucencies. Moderate disc degeneration at C5-6. Upper chest: Clear lung apices. Other: A predominantly low-density mass/collection in the lateral right upper neck with thick, irregular peripheral enhancement measures 5.5 x 5.6 x 7.4 cm. The mass is inseparable from the right sternocleidomastoid muscle, and there is deep extension towards the parapharyngeal space. There is extensive surrounding inflammatory stranding which extends into the lower neck. IMPRESSION: 7.4 cm right upper neck mass with extensive surrounding inflammation most consistent with an  abscess with this clinical history. Necrotic tumor is an alternative consideration. Electronically Signed   By: Sebastian Ache M.D.   On: 04/05/2021 16:26    Blood pressure 129/75, pulse 73, temperature (!) 102.5 F (39.2 C), temperature source Oral, resp. rate 16, height 6\' 2"  (1.88 m), weight 86.2 kg, SpO2 100 %. General appearance: alert, cooperative, and appears stated age Head: Normocephalic, without obvious abnormality, atraumatic Eyes: Pupils are equal, round, reactive to light. Extraocular motion is intact.  Ears: Examination of the ears shows normal auricles and external auditory canals bilaterally.   Nose: Nasal examination shows normal mucosa, septum, turbinates.  Face: Facial examination shows no asymmetry. Palpation of the face elicit no significant tenderness.  Mouth: Oral cavity examination shows no mucosal abnormalities. No significant trismus is noted.  Neck: Palpation of the neck reveals a large tender right neck mass, with surface erythema. Neuro: Cranial nerves 2-12 are all grossly in tact.   Assessment/Plan: Right neck abscess, possible lymphadenitis. CT reviewed. A malignancy is less likely based on the acute nature of his illness. - Agree with IV abx. - Will follow the patient's clinical response to antibiotics. - May have regular diet as tolerated.  Genevie Elman W  Davyd Podgorski 04/06/2021, 12:39 PM   

## 2021-04-06 NOTE — Progress Notes (Signed)
PROGRESS NOTE    Ernest Jenkins  YSA:[id number] DOB: 02-Jan-1981 DOA: 04/05/2021 PCP: Default, Provider, MD    Chief Complaint  Patient presents with   lump Rt side neck    Brief Narrative:  Ernest Jenkins is a 40 yo w/PMHx of EtOH abuse who presented w/lateral neck mass and difficulty swallowing.   Assessment & Plan:   Principal Problem:   Neck abscess Active Problems:   Sepsis (HCC)   Hyponatremia   Microcytic anemia   Neck Abscess CT: 7.4 cm right upper neck mass with extensive surrounding inflammation most consistent with an abscess with this clinical history.  - Patient reports poor dentition and has not been seen by Dentistry for some time, suspected source - c/w vanc & zosyn; if WBC downtrending and symptoms improving, can likely de-esc to Unasyn - pain regimen, cool compress - monitor for new hoarseness, drooling or worsening ability to swallow suggesting worsening  Hyponatremia Suspect some degree of hypovolemia given poor PO intake prior to admit - trend Na, encourage PO intake - IVF  Anemia Low Fe Sat, suspect Ferritin elevated due to inflammation/infection. MCV low enough to suggest possible thalassemia trait. - replete w/PO iron  Hx of alcohol abuse - CIWA protocol, can likely dc in AM if not triggering - famotidine for gastritis   DVT prophylaxis: SCDs given low padua score Code Status: full  Disposition:  Status is: Inpatient Remains inpatient appropriate because: medical necessity Can likely dc in a few days if abscess showing good response to antibx and ENT agreees    Consultants:  ENT  Procedures: n/a  Antimicrobials:  Anti-infectives (From admission, onward)    Start     Dose/Rate Route Frequency Ordered Stop   04/06/21 0300  vancomycin (VANCOREADY) IVPB 1250 mg/250 mL        1,250 mg 166.7 mL/hr over 90 Minutes Intravenous Every 12 hours 04/05/21 1421     04/05/21 1430  vancomycin (VANCOREADY) IVPB 1750 mg/350 mL        1,750 mg 175  mL/hr over 120 Minutes Intravenous  Once 04/05/21 1418 04/05/21 1645   04/05/21 1400  cefTRIAXone (ROCEPHIN) 1 g in sodium chloride 0.9 % 100 mL IVPB        1 g 200 mL/hr over 30 Minutes Intravenous Every 24 hours 04/05/21 1352             Objective: Vitals:   04/06/21 1200 04/06/21 1300 04/06/21 1400 04/06/21 1451  BP: 129/75 137/64 135/71 119/72  Pulse: 73 73 85 81  Resp: 16 15 11 18   Temp:    (!) 102.3 F (39.1 C)  TempSrc:    Oral  SpO2: 100% 100% 100% 99%  Weight:    72.8 kg  Height:    6\' 2"  (1.88 m)    Intake/Output Summary (Last 24 hours) at 04/06/2021 1744 Last data filed at 04/06/2021 1741 Gross per 24 hour  Intake 1971.82 ml  Output 800 ml  Net 1171.82 ml   Filed Weights   04/05/21 1228 04/06/21 1451  Weight: 86.2 kg 72.8 kg    Examination:  General exam: Appears calm and comfortable  HEENT: Tender erythematous induration of R lateral neck extending from mandible to R sternocleidomastoid Respiratory system: Clear to auscultation. Respiratory effort normal. Cardiovascular system: S1 & S2 heard, RRR. No JVD, murmurs, rubs, gallops or clicks. No pedal edema. Gastrointestinal system: Abdomen is nondistended, soft and nontender. No organomegaly or masses felt. Normal bowel sounds heard. Central nervous system: Alert and oriented. No  focal neurological deficits. Extremities: Symmetric, normal muscle bulk/tone Skin: No rashes, lesions or ulcers. Multiple tattoos Psychiatry: Judgement and insight appear normal. Mood & affect appropriate.    Data Reviewed: I have personally reviewed following labs and imaging studies  CBC: Recent Labs  Lab 04/05/21 1245 04/06/21 0400  WBC 20.1* 19.5*  NEUTROABS 16.9*  --   HGB 11.4* 10.2*  HCT 34.7* 31.0*  MCV 69.7* 70.0*  PLT 396 425*    Basic Metabolic Panel: Recent Labs  Lab 04/05/21 1245 04/06/21 0400  NA 129* 129*  K 4.3 3.8  CL 93* 96*  CO2 25 24  GLUCOSE 111* 110*  BUN 10 10  CREATININE 1.15 1.05   CALCIUM 9.2 8.6*    GFR: Estimated Creatinine Clearance: 94.4 mL/min (by C-G formula based on SCr of 1.05 mg/dL).  Liver Function Tests: Recent Labs  Lab 04/05/21 1245  AST 43*  ALT 40  ALKPHOS 63  BILITOT 0.8  PROT 7.8  ALBUMIN 3.1*    CBG: No results for input(s): GLUCAP in the last 168 hours.   Recent Results (from the past 240 hour(s))  Resp Panel by RT-PCR (Flu A&B, Covid) Nasopharyngeal Swab     Status: None   Collection Time: 04/05/21  1:55 PM   Specimen: Nasopharyngeal Swab; Nasopharyngeal(NP) swabs in vial transport medium  Result Value Ref Range Status   SARS Coronavirus 2 by RT PCR NEGATIVE NEGATIVE Final    Comment: (NOTE) SARS-CoV-2 target nucleic acids are NOT DETECTED.  The SARS-CoV-2 RNA is generally detectable in upper respiratory specimens during the acute phase of infection. The lowest concentration of SARS-CoV-2 viral copies this assay can detect is 138 copies/mL. A negative result does not preclude SARS-Cov-2 infection and should not be used as the sole basis for treatment or other patient management decisions. A negative result may occur with  improper specimen collection/handling, submission of specimen other than nasopharyngeal swab, presence of viral mutation(s) within the areas targeted by this assay, and inadequate number of viral copies(<138 copies/mL). A negative result must be combined with clinical observations, patient history, and epidemiological information. The expected result is Negative.  Fact Sheet for Patients:  BloggerCourse.com  Fact Sheet for Healthcare Providers:  SeriousBroker.it  This test is no t yet approved or cleared by the [place] FDA and  has been authorized for detection and/or diagnosis of SARS-CoV-2 by FDA under an Emergency Use Authorization (EUA). This EUA will remain  in effect (meaning this test can be used) for the duration of the COVID-19  declaration under Section 564(b)(1) of the Act, 21 U.S.C.section 360bbb-3(b)(1), unless the authorization is terminated  or revoked sooner.       Influenza A by PCR NEGATIVE NEGATIVE Final   Influenza B by PCR NEGATIVE NEGATIVE Final    Comment: (NOTE) The Xpert Xpress SARS-CoV-2/FLU/RSV plus assay is intended as an aid in the diagnosis of influenza from Nasopharyngeal swab specimens and should not be used as a sole basis for treatment. Nasal washings and aspirates are unacceptable for Xpert Xpress SARS-CoV-2/FLU/RSV testing.  Fact Sheet for Patients: BloggerCourse.com  Fact Sheet for Healthcare Providers: SeriousBroker.it  This test is not yet approved or cleared by the [place] FDA and has been authorized for detection and/or diagnosis of SARS-CoV-2 by FDA under an Emergency Use Authorization (EUA). This EUA will remain in effect (meaning this test can be used) for the duration of the COVID-19 declaration under Section 564(b)(1) of the Act, 21 U.S.C. section 360bbb-3(b)(1), unless the authorization is  terminated or revoked.  Performed at [facility], [address], [place], [place] [address]   Culture, blood (routine x 2)     Status: None (Preliminary result)   Collection Time: 04/05/21  2:11 PM   Specimen: BLOOD  Result Value Ref Range Status   Specimen Description BLOOD SITE NOT SPECIFIED  Final   Special Requests   Final    BOTTLES DRAWN AEROBIC AND ANAEROBIC Blood Culture results may not be optimal due to an inadequate volume of blood received in culture bottles   Culture   Final    NO GROWTH 1 DAY Performed at [facility], [address], [place], [place] [address]    Report Status PENDING  Incomplete  Culture, blood (routine x 2)     Status: None (Preliminary result)   Collection Time: 04/05/21  2:44 PM   Specimen: BLOOD  Result Value Ref Range Status   Specimen Description BLOOD RIGHT  ANTECUBITAL  Final   Special Requests   Final    BOTTLES DRAWN AEROBIC AND ANAEROBIC Blood Culture adequate volume   Culture   Final    NO GROWTH 1 DAY Performed at [facility], [address], [place], [place] [address]    Report Status PENDING  Incomplete         Radiology Studies: CT Soft Tissue Neck W Contrast  Result Date: 04/05/2021 CLINICAL DATA:  Right neck mass for 1 week.  Fever and leukocytosis. EXAM: CT NECK WITH CONTRAST TECHNIQUE: Multidetector CT imaging of the neck was performed using the standard protocol following the bolus administration of intravenous contrast. CONTRAST:  53mL OMNIPAQUE IOHEXOL 300 MG/ML  SOLN COMPARISON:  None. FINDINGS: Pharynx and larynx: No mass. Calcification along the anterior aspect of the right palatine tonsil. No retropharyngeal fluid collection. Widely patent airway. Salivary glands: Mild mass effect on the right parotid and right submandibular glands by the large right neck mass/collection described below which is not felt to be of primary salivary origin. Unremarkable appearance of the left submandibular and left parotid glands. Thyroid: Subcentimeter calcification in the left thyroid lobe for which no imaging follow-up is recommended. Lymph nodes: Subcentimeter short axis lymph nodes in the neck bilaterally. Vascular: Nonvisualization of the right internal jugular vein in the mid and upper neck, either completely effaced or occluded. Limited intracranial: Unremarkable. Visualized orbits: Unremarkable. Mastoids and visualized paranasal sinuses: Minimal mucosal thickening in the maxillary sinuses. Clear mastoid air cells. Skeleton: Scattered dental caries and periapical lucencies. Moderate disc degeneration at C5-6. Upper chest: Clear lung apices. Other: A predominantly low-density mass/collection in the lateral right upper neck with thick, irregular peripheral enhancement measures 5.5 x 5.6 x 7.4 cm. The mass is inseparable from the right  sternocleidomastoid muscle, and there is deep extension towards the parapharyngeal space. There is extensive surrounding inflammatory stranding which extends into the lower neck. IMPRESSION: 7.4 cm right upper neck mass with extensive surrounding inflammation most consistent with an abscess with this clinical history. Necrotic tumor is an alternative consideration. Electronically Signed   By: Sebastian Ache M.D.   On: 04/05/2021 16:26        Scheduled Meds:  [START ON 04/07/2021] famotidine  20 mg Oral Daily   folic acid  1 mg Oral Daily   multivitamin with minerals  1 tablet Oral Daily   thiamine  100 mg Oral Daily   Continuous Infusions:  cefTRIAXone (ROCEPHIN)  IV Stopped (04/06/21 1429)   lactated ringers 75 mL/hr at 04/06/21  1512   vancomycin 1,250 mg (04/06/21 1709)     LOS: 0 days    Rachael Fee, MD [facility]   To contact the attending provider between 7A-7P or the covering provider during after hours 7P-7A, please log into the web site [email or web address] and access using universal [place] password for that web site. If you do not have the password, please call the hospital operator.  04/06/2021, 5:44 PM

## 2021-04-06 NOTE — Consult Note (Signed)
Reason for Consult: RIght neck abscess  HPI:  Ernest Jenkins is an 40 y.o. male who presented to the ER yesterday complaining of right neck swelling and pain. The patient first noted the symptoms 1 week ago. The onset was acute. Patient reports that he has been tolerating p.o intake without difficulty.  Patient denies recent injury or trauma.  Patient denies history of diabetes.  Denies recent bug bite.  He has associated subjective fever and redness to the area.  He has not tried any medications for his symptoms.  Patient denies trouble breathing, dental pain, trouble swallowing, chest pain, abdominal pain, nausea, vomiting, fever, chills, neck pain, ear pain, or sore throat  No past medical history on file.  No family history on file.  Social History:  has no history on file for tobacco use, alcohol use, and drug use.  Allergies: No Known Allergies  Prior to Admission medications   Not on File    Medications: I have reviewed the patient's current medications. Scheduled:  folic acid  1 mg Oral Daily   multivitamin with minerals  1 tablet Oral Daily   thiamine  100 mg Oral Daily   Continuous:  cefTRIAXone (ROCEPHIN)  IV Stopped (04/05/21 1516)   lactated ringers 75 mL/hr at 04/06/21 0610   vancomycin Stopped (04/06/21 0400)   HUD:[id number], LORazepam **OR** LORazepam, oxyCODONE  Results for orders placed or performed during the hospital encounter of 04/05/21 (from the past 48 hour(s))  Comprehensive metabolic panel     Status: Abnormal   Collection Time: 04/05/21 12:45 PM  Result Value Ref Range   Sodium 129 (L) 135 - 145 mmol/L   Potassium 4.3 3.5 - 5.1 mmol/L   Chloride 93 (L) 98 - 111 mmol/L   CO2 25 22 - 32 mmol/L   Glucose, Bld 111 (H) 70 - 99 mg/dL    Comment: Glucose reference range applies only to samples taken after fasting for at least 8 hours.   BUN 10 6 - 20 mg/dL   Creatinine, Ser 5.02 0.61 - 1.24 mg/dL   Calcium 9.2 8.9 - 77.4 mg/dL   Total Protein 7.8 6.5 -  8.1 g/dL   Albumin 3.1 (L) 3.5 - 5.0 g/dL   AST 43 (H) 15 - 41 U/L   ALT 40 0 - 44 U/L   Alkaline Phosphatase 63 38 - 126 U/L   Total Bilirubin 0.8 0.3 - 1.2 mg/dL   GFR, Estimated >12 >87 mL/min    Comment: (NOTE) Calculated using the CKD-EPI Creatinine Equation (2021)    Anion gap 11 5 - 15    Comment: Performed at [facility], [address], [place], [place] [address]  CBC with Differential     Status: Abnormal   Collection Time: 04/05/21 12:45 PM  Result Value Ref Range   WBC 20.1 (H) 4.0 - 10.5 K/uL   RBC 4.98 4.22 - 5.81 MIL/uL   Hemoglobin 11.4 (L) 13.0 - 17.0 g/dL   HCT 20.9 (L) 47.0 - 96.2 %   MCV 69.7 (L) 80.0 - 100.0 fL    Comment: REPEATED TO VERIFY   MCH 22.9 (L) 26.0 - 34.0 pg   MCHC 32.9 30.0 - 36.0 g/dL   RDW 83.6 62.9 - 47.6 %   Platelets 396 150 - 400 K/uL    Comment: REPEATED TO VERIFY   nRBC 0.0 0.0 - 0.2 %   Neutrophils Relative % 84 %   Neutro Abs 16.9 (H) 1.7 - 7.7 K/uL   Lymphocytes  Relative 8 %   Lymphs Abs 1.6 0.7 - 4.0 K/uL   Monocytes Relative 8 %   Monocytes Absolute 1.6 (H) 0.1 - 1.0 K/uL   Eosinophils Relative 0 %   Eosinophils Absolute 0.0 0.0 - 0.5 K/uL   Basophils Relative 0 %   Basophils Absolute 0.0 0.0 - 0.1 K/uL   WBC Morphology See Note     Comment: Increased Bands. >20% Bands  Toxic Granulation  Vaculated Neutrophils    nRBC 0 0 /100 WBC   Abs Immature Granulocytes 0.00 0.00 - 0.07 K/uL    Comment: Performed at [facility], [address], [place], [place] [address]  Resp Panel by RT-PCR (Flu A&B, Covid) Nasopharyngeal Swab     Status: None   Collection Time: 04/05/21  1:55 PM   Specimen: Nasopharyngeal Swab; Nasopharyngeal(NP) swabs in vial transport medium  Result Value Ref Range   SARS Coronavirus 2 by RT PCR NEGATIVE NEGATIVE    Comment: (NOTE) SARS-CoV-2 target nucleic acids are NOT DETECTED.  The SARS-CoV-2 RNA is generally detectable in upper respiratory specimens during the acute phase of infection.  The lowest concentration of SARS-CoV-2 viral copies this assay can detect is 138 copies/mL. A negative result does not preclude SARS-Cov-2 infection and should not be used as the sole basis for treatment or other patient management decisions. A negative result may occur with  improper specimen collection/handling, submission of specimen other than nasopharyngeal swab, presence of viral mutation(s) within the areas targeted by this assay, and inadequate number of viral copies(<138 copies/mL). A negative result must be combined with clinical observations, patient history, and epidemiological information. The expected result is Negative.  Fact Sheet for Patients:  BloggerCourse.com  Fact Sheet for Healthcare Providers:  SeriousBroker.it  This test is no t yet approved or cleared by the [place] FDA and  has been authorized for detection and/or diagnosis of SARS-CoV-2 by FDA under an Emergency Use Authorization (EUA). This EUA will remain  in effect (meaning this test can be used) for the duration of the COVID-19 declaration under Section 564(b)(1) of the Act, 21 U.S.C.section 360bbb-3(b)(1), unless the authorization is terminated  or revoked sooner.       Influenza A by PCR NEGATIVE NEGATIVE   Influenza B by PCR NEGATIVE NEGATIVE    Comment: (NOTE) The Xpert Xpress SARS-CoV-2/FLU/RSV plus assay is intended as an aid in the diagnosis of influenza from Nasopharyngeal swab specimens and should not be used as a sole basis for treatment. Nasal washings and aspirates are unacceptable for Xpert Xpress SARS-CoV-2/FLU/RSV testing.  Fact Sheet for Patients: BloggerCourse.com  Fact Sheet for Healthcare Providers: SeriousBroker.it  This test is not yet approved or cleared by the [place] FDA and has been authorized for detection and/or diagnosis of SARS-CoV-2 by FDA under an  Emergency Use Authorization (EUA). This EUA will remain in effect (meaning this test can be used) for the duration of the COVID-19 declaration under Section 564(b)(1) of the Act, 21 U.S.C. section 360bbb-3(b)(1), unless the authorization is terminated or revoked.  Performed at [facility], [address], [place], [place] [address]   Lactic acid, plasma     Status: None   Collection Time: 04/05/21  2:11 PM  Result Value Ref Range   Lactic Acid, Venous 1.2 0.5 - 1.9 mmol/L    Comment: Performed at [facility], [address] [place], [place] [address]  TSH     Status: None   Collection Time: 04/05/21  7:46 PM  Result Value Ref Range   TSH 2.364 0.350 - 4.500 uIU/mL    Comment: Performed by a 3rd Generation assay with a functional sensitivity of <=0.01 uIU/mL. Performed at [facility], [address] [place], [place] [address]   Vitamin B12     Status: None   Collection Time: 04/05/21  7:53 PM  Result Value Ref Range   Vitamin B-12 415 180 - 914 pg/mL    Comment: (NOTE) This assay is not validated for testing neonatal or myeloproliferative syndrome specimens for Vitamin B12 levels. Performed at [facility], [address] [place], [place] [address]   Folate, serum, performed at [facility] lab     Status: None   Collection Time: 04/05/21  7:53 PM  Result Value Ref Range   Folate 12.6 >5.9 ng/mL    Comment: Performed at [facility], [address] [place], [place] [address]  Iron and TIBC     Status: Abnormal   Collection Time: 04/05/21  7:53 PM  Result Value Ref Range   Iron 18 (L) 45 - 182 ug/dL   TIBC 229 (L) 798 - 921 ug/dL   Saturation Ratios 8 (L) 17.9 - 39.5 %   UIBC 221 ug/dL    Comment: Performed at [facility], [address], [place], [place] [address]  Ferritin     Status: Abnormal   Collection Time: 04/05/21  7:53 PM  Result Value Ref Range   Ferritin 581 (H) 24 - 336 ng/mL    Comment: Performed at [facility], [address] [place], [place] [address]  HIV Antibody (routine testing w rflx)     Status: None   Collection Time: 04/05/21  7:53 PM  Result Value Ref Range   HIV Screen 4th Generation wRfx Non Reactive Non Reactive    Comment: Performed at [facility], [address] [place], [place] [address]  Sodium, urine, random     Status: None   Collection Time: 04/06/21  1:48 AM  Result Value Ref Range   Sodium, Ur 50 mmol/L    Comment: Performed at [facility], [address], [place], [place] [address]  Osmolality, urine     Status: None   Collection Time: 04/06/21  1:48 AM  Result Value Ref Range   Osmolality, Ur 750 300 - 900 mOsm/kg    Comment: Performed at [facility], [address] [place], [place] [address]  Basic metabolic panel     Status: Abnormal   Collection Time: 04/06/21  4:00 AM  Result Value Ref Range   Sodium 129 (L) 135 - 145 mmol/L   Potassium 3.8 3.5 - 5.1 mmol/L   Chloride 96 (L) 98 - 111 mmol/L   CO2 24 22 - 32 mmol/L   Glucose, Bld 110 (H) 70 - 99 mg/dL    Comment: Glucose reference range applies only to samples taken after fasting for at least 8 hours.   BUN 10 6 - 20 mg/dL   Creatinine, Ser 7.85 0.61 - 1.24 mg/dL   Calcium 8.6 (L) 8.9 - 10.3 mg/dL   GFR, Estimated >88 >50 mL/min    Comment: (NOTE) Calculated using the CKD-EPI Creatinine Equation (2021)    Anion gap 9 5 - 15    Comment: Performed at [facility], [address] [place], [place] [address]  CBC     Status: Abnormal   Collection Time:  04/06/21  4:00 AM  Result Value Ref Range   WBC 19.5 (H) 4.0 - 10.5 K/uL   RBC 4.43 4.22 - 5.81 MIL/uL   Hemoglobin 10.2 (L) 13.0 - 17.0 g/dL   HCT 42.1 (L) 03.1 - 28.1 %   MCV 70.0 (L) 80.0 - 100.0 fL   MCH 23.0 (L) 26.0 - 34.0 pg   MCHC 32.9 30.0 - 36.0 g/dL   RDW 18.8 67.7 - 37.3 %   Platelets 425 (H) 150 - 400 K/uL   nRBC 0.0 0.0 - 0.2 %    Comment: Performed at [facility], [address]  [place], [place] [address]    CT Soft Tissue Neck W Contrast  Result Date: 04/05/2021 CLINICAL DATA:  Right neck mass for 1 week.  Fever and leukocytosis. EXAM: CT NECK WITH CONTRAST TECHNIQUE: Multidetector CT imaging of the neck was performed using the standard protocol following the bolus administration of intravenous contrast. CONTRAST:  64mL OMNIPAQUE IOHEXOL 300 MG/ML  SOLN COMPARISON:  None. FINDINGS: Pharynx and larynx: No mass. Calcification along the anterior aspect of the right palatine tonsil. No retropharyngeal fluid collection. Widely patent airway. Salivary glands: Mild mass effect on the right parotid and right submandibular glands by the large right neck mass/collection described below which is not felt to be of primary salivary origin. Unremarkable appearance of the left submandibular and left parotid glands. Thyroid: Subcentimeter calcification in the left thyroid lobe for which no imaging follow-up is recommended. Lymph nodes: Subcentimeter short axis lymph nodes in the neck bilaterally. Vascular: Nonvisualization of the right internal jugular vein in the mid and upper neck, either completely effaced or occluded. Limited intracranial: Unremarkable. Visualized orbits: Unremarkable. Mastoids and visualized paranasal sinuses: Minimal mucosal thickening in the maxillary sinuses. Clear mastoid air cells. Skeleton: Scattered dental caries and periapical lucencies. Moderate disc degeneration at C5-6. Upper chest: Clear lung apices. Other: A predominantly low-density mass/collection in the lateral right upper neck with thick, irregular peripheral enhancement measures 5.5 x 5.6 x 7.4 cm. The mass is inseparable from the right sternocleidomastoid muscle, and there is deep extension towards the parapharyngeal space. There is extensive surrounding inflammatory stranding which extends into the lower neck. IMPRESSION: 7.4 cm right upper neck mass with extensive surrounding inflammation most consistent with an  abscess with this clinical history. Necrotic tumor is an alternative consideration. Electronically Signed   By: Sebastian Ache M.D.   On: 04/05/2021 16:26    Blood pressure 129/75, pulse 73, temperature (!) 102.5 F (39.2 C), temperature source Oral, resp. rate 16, height 6\' 2"  (1.88 m), weight 86.2 kg, SpO2 100 %. General appearance: alert, cooperative, and appears stated age Head: Normocephalic, without obvious abnormality, atraumatic Eyes: Pupils are equal, round, reactive to light. Extraocular motion is intact.  Ears: Examination of the ears shows normal auricles and external auditory canals bilaterally.   Nose: Nasal examination shows normal mucosa, septum, turbinates.  Face: Facial examination shows no asymmetry. Palpation of the face elicit no significant tenderness.  Mouth: Oral cavity examination shows no mucosal abnormalities. No significant trismus is noted.  Neck: Palpation of the neck reveals a large tender right neck mass, with surface erythema. Neuro: Cranial nerves 2-12 are all grossly in tact.   Assessment/Plan: Right neck abscess, possible lymphadenitis. CT reviewed. A malignancy is less likely based on the acute nature of his illness. - Agree with IV abx. - Will follow the patient's clinical response to antibiotics. - May have regular diet as tolerated.  Nanette Wirsing W  Ilynn Stauffer 04/06/2021, 12:39 PM

## 2021-04-07 ENCOUNTER — Encounter (HOSPITAL_COMMUNITY): Payer: Self-pay | Admitting: Internal Medicine

## 2021-04-07 LAB — BASIC METABOLIC PANEL
Anion gap: 15 (ref 5–15)
BUN: 5 mg/dL — ABNORMAL LOW (ref 6–20)
CO2: 23 mmol/L (ref 22–32)
Calcium: 8.3 mg/dL — ABNORMAL LOW (ref 8.9–10.3)
Chloride: 91 mmol/L — ABNORMAL LOW (ref 98–111)
Creatinine, Ser: 0.86 mg/dL (ref 0.61–1.24)
GFR, Estimated: >60 mL/min (ref >60)
Glucose, Bld: 137 mg/dL — ABNORMAL HIGH (ref 70–99)
Potassium: 3.9 mmol/L (ref 3.5–5.1)
Sodium: 129 mmol/L — ABNORMAL LOW (ref 135–145)

## 2021-04-07 LAB — CBC
HCT: 34.1 % — ABNORMAL LOW (ref 39.0–52.0)
Hemoglobin: 10.9 g/dL — ABNORMAL LOW (ref 13.0–17.0)
MCH: 22.6 pg — ABNORMAL LOW (ref 26.0–34.0)
MCHC: 32 g/dL (ref 30.0–36.0)
MCV: 70.7 fL — ABNORMAL LOW (ref 80.0–100.0)
Platelets: 545 10*3/uL — ABNORMAL HIGH (ref 150–400)
RBC: 4.82 MIL/uL (ref 4.22–5.81)
RDW: 13.1 % (ref 11.5–15.5)
WBC: 22.5 10*3/uL — ABNORMAL HIGH (ref 4.0–10.5)
nRBC: 0 % (ref 0.0–0.2)

## 2021-04-07 MED ORDER — LACTATED RINGERS IV SOLN: INTRAVENOUS | Status: AC

## 2021-04-07 MED ORDER — CLINDAMYCIN PHOSPHATE 300 MG/50ML IV SOLN: 300.0000 mg | Freq: Four times a day (QID) | INTRAVENOUS | Status: DC

## 2021-04-07 MED ORDER — ENSURE ENLIVE PO LIQD
237.0000 mL | Freq: Two times a day (BID) | ORAL | Status: DC
  Administered 2021-04-07 – 2021-04-11 (×8): 237 mL via ORAL

## 2021-04-07 MED ORDER — CLINDAMYCIN PHOSPHATE 600 MG/50ML IV SOLN
600.0000 mg | Freq: Three times a day (TID) | INTRAVENOUS | Status: DC
  Administered 2021-04-07 – 2021-04-11 (×12): 600 mg via INTRAVENOUS
  Filled 2021-04-07 (×14): qty 50

## 2021-04-07 MED ORDER — DEXAMETHASONE SODIUM PHOSPHATE 10 MG/ML IJ SOLN: 10.0000 mg | Freq: Three times a day (TID) | INTRAMUSCULAR | Status: DC

## 2021-04-07 MED ORDER — DEXAMETHASONE SODIUM PHOSPHATE 10 MG/ML IJ SOLN
10.0000 mg | Freq: Three times a day (TID) | INTRAMUSCULAR | Status: DC
  Administered 2021-04-07 – 2021-04-11 (×12): 10 mg via INTRAVENOUS
  Filled 2021-04-07 (×14): qty 1

## 2021-04-07 NOTE — Progress Notes (Signed)
Subjective: Pt continues to have right neck swelling and tenderness. Also c/o dysphagia.  Objective: Vital signs in last 24 hours: Temp:  [98.1 F (36.7 C)-102.3 F (39.1 C)] 98.1 F (36.7 C) (11/26 0937) Pulse Rate:  [67-81] 81 (11/26 0937) Resp:  [16-20] 17 (11/26 0937) BP: (113-124)/(66-74) 115/69 (11/26 0937) SpO2:  [99 %-100 %] 100 % (11/26 0937) Weight:  [72.8 kg] 72.8 kg (11/25 1451)  General appearance: alert, cooperative, and appears stated age Head: Normocephalic, without obvious abnormality, atraumatic Eyes: Pupils are equal, round, reactive to light. Extraocular motion is intact.  Ears: Examination of the ears shows normal auricles and external auditory canals bilaterally.   Nose: Nasal examination shows normal mucosa, septum, turbinates.  Face: Facial examination shows no asymmetry. Palpation of the face elicit no significant tenderness.  Mouth: Oral cavity examination shows no mucosal abnormalities. No significant trismus is noted.  Neck: Palpation of the neck reveals a large tender right neck mass, with surface erythema. Neuro: Cranial nerves 2-12 are all grossly in tact.      Recent Labs    04/06/21 0400 04/07/21 0743  WBC 19.5* 22.5*  HGB 10.2* 10.9*  HCT 31.0* 34.1*  PLT 425* 545*   Recent Labs    04/06/21 0400 04/07/21 0743  NA 129* 129*  K 3.8 3.9  CL 96* 91*  CO2 24 23  GLUCOSE 110* 137*  BUN 10 5*  CREATININE 1.05 0.86  CALCIUM 8.6* 8.3*    Medications: I have reviewed the patient's current medications. Scheduled:  dexamethasone (DECADRON) injection  10 mg Intravenous Q8H   famotidine  20 mg Oral Daily   feeding supplement  237 mL Oral BID BM   ferrous sulfate  325 mg Oral Q breakfast   folic acid  1 mg Oral Daily   multivitamin with minerals  1 tablet Oral Daily   thiamine  100 mg Oral Daily   Continuous:  cefTRIAXone (ROCEPHIN)  IV 1 g (04/07/21 1420)   lactated ringers 50 mL/hr at 04/07/21 1130   vancomycin 1,250 mg (04/07/21  1418)    Assessment/Plan: Right neck abscess, possible lymphadenitis. Still symptomatic - Continue with IV abx. Consider changing to clindamycin 300mg  QID. - Will follow the patient's clinical response to antibiotics. - Agree with decadron IV. - Advance diet as tolerated.   LOS: 1 day   Arsenio Schnorr W Lamar Meter 04/07/2021, 2:25 PM

## 2021-04-07 NOTE — Progress Notes (Signed)
Initial Nutrition Assessment  DOCUMENTATION CODES:   Not applicable  INTERVENTION:   Ensure Enlive po BID, each supplement provides 350 kcal and 20 grams of protein  Continue MVI with minerals daily  NUTRITION DIAGNOSIS:   Increased nutrient needs related to wound healing, acute illness (neck abscess) as evidenced by estimated needs.  GOAL:   Patient will meet greater than or equal to 90% of their needs  MONITOR:   PO intake, Supplement acceptance, Labs, Skin  REASON FOR ASSESSMENT:   Malnutrition Screening Tool    ASSESSMENT:   40 yo male admitted with R lateral neck abscess, difficulty swallowing. PMH includes ETOH abuse.  ENT following. Plans to begin IV decadron to reduce swelling. Receiving IV antibiotics.  Patient is currently on a regular diet.  Meal intakes: 75-100%  Patient reports usual weight 190 lbs. He has had difficulty eating much at home d/t neck pain and difficulty swallowing for the past 1-2 weeks. Since admission, swallowing and pain has improved. Given history of ETOH abuse and reported weight loss, patient is at increased nutrition risk. Discussed the importance of adequate protein and calorie intake to promote healing. Patient willing to try Ensure supplements.  Current weight is 160 lbs (72.8 kg). 16% weight loss within the past month is significant. No weights documented in chart prior to current admission to confirm usual weight.   Labs reviewed. Na 129, BUN 5  Medications reviewed and include Decadron, Pepcid, ferrous sulfate, folic acide, MVI with minerals, thiamine, IV Rocephin, IV vancomycin. IVF: LR at 50 ml/h  NUTRITION - FOCUSED PHYSICAL EXAM:  Unable to complete  Diet Order:   Diet Order             Diet regular Room service appropriate? Yes; Fluid consistency: Thin  Diet effective now                   EDUCATION NEEDS:   No education needs have been identified at this time  Skin:  Skin Assessment: Reviewed RN  Assessment  Last BM:  11/25  Height:   Ht Readings from Last 1 Encounters:  04/06/21 6\' 2"  (1.88 m)    Weight:   Wt Readings from Last 1 Encounters:  04/06/21 72.8 kg     BMI:  Body mass index is 20.61 kg/m.  Estimated Nutritional Needs:   Kcal:  2200-2400  Protein:  110-130 gm  Fluid:  >/= 2.4 L   04/08/21, RD, LDN, CNSC Please refer to Amion for contact information.

## 2021-04-07 NOTE — Progress Notes (Signed)
PROGRESS NOTE    Ernest Jenkins  ZJQ:[id number] DOB: 1980-08-09 DOA: 04/05/2021 PCP: Default, Provider, MD    Chief Complaint  Patient presents with   lump Rt side neck    Brief Narrative:  Ernest Jenkins is a 40 yo w/PMHx of EtOH abuse who presented w/right lateral neck mass and difficulty swallowing.  Work up revealed large right lateral neck abscess measuring 7.4 cm by CT scan.  Started on empiric IV antibiotics IV vancomycin and Rocephin, ENT was consulted and is following.  Blood cultures obtained and are negative to date.  04/07/2021: Patient was seen and examined at bedside.  He reports some difficulty swallowing due to the swelling in the right side of his neck.  Discussed with ENT, recommending IV Decadron 10 mg every 8 hours.  ENT will follow.   Assessment & Plan:   Principal Problem:   Neck abscess Active Problems:   Sepsis (HCC)   Hyponatremia   Microcytic anemia   Large right lateral Neck Abscess, poa CT: 7.4 cm right upper neck mass with extensive surrounding inflammation most consistent with an abscess with this clinical history.  - Patient reports poor dentition and has not been seen by Dentistry for some time, suspected source Continue IV vanc & zosyn; WBC is uptrending Blood cultures negative to date. Pain management in place, cool compress IV Decadron 10 mg every 8 hours added on 11/26 ENT following.   Concern for dysphagia in the setting of large right lateral neck abscess Speech therapist to evaluate Aspiration precautions  Refractory hypovolemic Hyponatremia Suspect some degree of hypovolemia given poor PO intake prior to admit Continue to trend serum sodium level. Encourage p.o. intake as tolerated Continue IV fluid hydration.  Chronic microcytic anemia Low Fe Sat, suspect Ferritin elevated due to inflammation/infection. MCV low enough to suggest possible thalassemia trait. Continue replete w/PO iron  Hx of alcohol abuse Continue CIWA  protocol Continue folic acid and thiamine supplements - famotidine for gastritis   DVT prophylaxis: SCDs given low padua score Code Status: full  Disposition:  Status is: Inpatient Remains inpatient appropriate because: medical necessity Can likely dc in a few days if abscess showing good response to antibx and ENT agreees    Consultants:  ENT  Procedures: n/a  Antimicrobials:  Anti-infectives (From admission, onward)    Start     Dose/Rate Route Frequency Ordered Stop   04/06/21 0300  vancomycin (VANCOREADY) IVPB 1250 mg/250 mL        1,250 mg 166.7 mL/hr over 90 Minutes Intravenous Every 12 hours 04/05/21 1421     04/05/21 1430  vancomycin (VANCOREADY) IVPB 1750 mg/350 mL        1,750 mg 175 mL/hr over 120 Minutes Intravenous  Once 04/05/21 1418 04/05/21 1645   04/05/21 1400  cefTRIAXone (ROCEPHIN) 1 g in sodium chloride 0.9 % 100 mL IVPB        1 g 200 mL/hr over 30 Minutes Intravenous Every 24 hours 04/05/21 1352             Objective: Vitals:   04/06/21 1911 04/06/21 2321 04/07/21 0616 04/07/21 0937  BP: 124/74 124/73 113/66 115/69  Pulse: 77 67 74 81  Resp: 16 20 16 17   Temp: (!) 101.4 F (38.6 C) 99.9 F (37.7 C) 100 F (37.8 C) 98.1 F (36.7 C)  TempSrc: Oral Oral Oral   SpO2: 100% 100% 100% 100%  Weight:      Height:        Intake/Output Summary (Last  24 hours) at 04/07/2021 1327 Last data filed at 04/07/2021 0800 Gross per 24 hour  Intake 2813.61 ml  Output --  Net 2813.61 ml   Filed Weights   04/05/21 1228 04/06/21 1451  Weight: 86.2 kg 72.8 kg    Examination:  General exam: Well-developed well-nourished no acute distress. HEENT: Tender, erythematous, induration of right lateral neck extending from the mandible to the right sternocleidomastoid.   Respiratory system: Clear to auscultation with no wheezes or rales.  Poor inspiratory effort.   Cardiovascular system: Regular rate and rhythm no rubs or gallops.  No JVD or thyromegaly  noted. Gastrointestinal system: Nondistended soft normal bowel sounds.   Central nervous system: Alert and oriented.  Moves all 4 extremities freely.   Extremities: Lower extremity edema bilaterally.  2 out of 4 pulses in all 4 extremities. Skin: Mild erythema noted to the right lateral neck.  Edema from underlying abscess. Psychiatry: Mood is appropriate for condition and setting.  Data Reviewed: I have personally reviewed following labs and imaging studies  CBC: Recent Labs  Lab 04/05/21 1245 04/06/21 0400 04/07/21 0743  WBC 20.1* 19.5* 22.5*  NEUTROABS 16.9*  --   --   HGB 11.4* 10.2* 10.9*  HCT 34.7* 31.0* 34.1*  MCV 69.7* 70.0* 70.7*  PLT 396 425* 545*    Basic Metabolic Panel: Recent Labs  Lab 04/05/21 1245 04/06/21 0400 04/07/21 0743  NA 129* 129* 129*  K 4.3 3.8 3.9  CL 93* 96* 91*  CO2 25 24 23   GLUCOSE 111* 110* 137*  BUN 10 10 5*  CREATININE 1.15 1.05 0.86  CALCIUM 9.2 8.6* 8.3*    GFR: Estimated Creatinine Clearance: 115.2 mL/min (by C-G formula based on SCr of 0.86 mg/dL).  Liver Function Tests: Recent Labs  Lab 04/05/21 1245  AST 43*  ALT 40  ALKPHOS 63  BILITOT 0.8  PROT 7.8  ALBUMIN 3.1*    CBG: No results for input(s): GLUCAP in the last 168 hours.   Recent Results (from the past 240 hour(s))  Resp Panel by RT-PCR (Flu A&B, Covid) Nasopharyngeal Swab     Status: None   Collection Time: 04/05/21  1:55 PM   Specimen: Nasopharyngeal Swab; Nasopharyngeal(NP) swabs in vial transport medium  Result Value Ref Range Status   SARS Coronavirus 2 by RT PCR NEGATIVE NEGATIVE Final    Comment: (NOTE) SARS-CoV-2 target nucleic acids are NOT DETECTED.  The SARS-CoV-2 RNA is generally detectable in upper respiratory specimens during the acute phase of infection. The lowest concentration of SARS-CoV-2 viral copies this assay can detect is 138 copies/mL. A negative result does not preclude SARS-Cov-2 infection and should not be used as the sole  basis for treatment or other patient management decisions. A negative result may occur with  improper specimen collection/handling, submission of specimen other than nasopharyngeal swab, presence of viral mutation(s) within the areas targeted by this assay, and inadequate number of viral copies(<138 copies/mL). A negative result must be combined with clinical observations, patient history, and epidemiological information. The expected result is Negative.  Fact Sheet for Patients:  04/07/21  Fact Sheet for Healthcare Providers:  BloggerCourse.com  This test is no t yet approved or cleared by the [place] FDA and  has been authorized for detection and/or diagnosis of SARS-CoV-2 by FDA under an Emergency Use Authorization (EUA). This EUA will remain  in effect (meaning this test can be used) for the duration of the COVID-19 declaration under Section 564(b)(1) of the Act, 21 U.S.C.section 360bbb-3(b)(1),  unless the authorization is terminated  or revoked sooner.       Influenza A by PCR NEGATIVE NEGATIVE Final   Influenza B by PCR NEGATIVE NEGATIVE Final    Comment: (NOTE) The Xpert Xpress SARS-CoV-2/FLU/RSV plus assay is intended as an aid in the diagnosis of influenza from Nasopharyngeal swab specimens and should not be used as a sole basis for treatment. Nasal washings and aspirates are unacceptable for Xpert Xpress SARS-CoV-2/FLU/RSV testing.  Fact Sheet for Patients: BloggerCourse.com  Fact Sheet for Healthcare Providers: SeriousBroker.it  This test is not yet approved or cleared by the [place] FDA and has been authorized for detection and/or diagnosis of SARS-CoV-2 by FDA under an Emergency Use Authorization (EUA). This EUA will remain in effect (meaning this test can be used) for the duration of the COVID-19 declaration under Section 564(b)(1) of the Act,  21 U.S.C. section 360bbb-3(b)(1), unless the authorization is terminated or revoked.  Performed at [facility], [address] [place], [place] [address]   Culture, blood (routine x 2)     Status: None (Preliminary result)   Collection Time: 04/05/21  2:11 PM   Specimen: BLOOD  Result Value Ref Range Status   Specimen Description BLOOD SITE NOT SPECIFIED  Final   Special Requests   Final    BOTTLES DRAWN AEROBIC AND ANAEROBIC Blood Culture results may not be optimal due to an inadequate volume of blood received in culture bottles   Culture   Final    NO GROWTH 2 DAYS Performed at [facility], [address] [place], [place] [address]    Report Status PENDING  Incomplete  Culture, blood (routine x 2)     Status: None (Preliminary result)   Collection Time: 04/05/21  2:44 PM   Specimen: BLOOD  Result Value Ref Range Status   Specimen Description BLOOD RIGHT ANTECUBITAL  Final   Special Requests   Final    BOTTLES DRAWN AEROBIC AND ANAEROBIC Blood Culture adequate volume   Culture   Final    NO GROWTH 2 DAYS Performed at [facility], [address] [place], [place] [address]    Report Status PENDING  Incomplete         Radiology Studies: CT Soft Tissue Neck W Contrast  Result Date: 04/05/2021 CLINICAL DATA:  Right neck mass for 1 week.  Fever and leukocytosis. EXAM: CT NECK WITH CONTRAST TECHNIQUE: Multidetector CT imaging of the neck was performed using the standard protocol following the bolus administration of intravenous contrast. CONTRAST:  13mL OMNIPAQUE IOHEXOL 300 MG/ML  SOLN COMPARISON:  None. FINDINGS: Pharynx and larynx: No mass. Calcification along the anterior aspect of the right palatine tonsil. No retropharyngeal fluid collection. Widely patent airway. Salivary glands: Mild mass effect on the right parotid and right submandibular glands by the large right neck mass/collection described below which is not felt to be of primary salivary  origin. Unremarkable appearance of the left submandibular and left parotid glands. Thyroid: Subcentimeter calcification in the left thyroid lobe for which no imaging follow-up is recommended. Lymph nodes: Subcentimeter short axis lymph nodes in the neck bilaterally. Vascular: Nonvisualization of the right internal jugular vein in the mid and upper neck, either completely effaced or occluded. Limited intracranial: Unremarkable. Visualized orbits: Unremarkable. Mastoids and visualized paranasal sinuses: Minimal mucosal thickening in the maxillary sinuses. Clear mastoid air cells. Skeleton: Scattered dental caries and periapical lucencies. Moderate disc degeneration at C5-6. Upper chest: Clear lung apices. Other: A predominantly  low-density mass/collection in the lateral right upper neck with thick, irregular peripheral enhancement measures 5.5 x 5.6 x 7.4 cm. The mass is inseparable from the right sternocleidomastoid muscle, and there is deep extension towards the parapharyngeal space. There is extensive surrounding inflammatory stranding which extends into the lower neck. IMPRESSION: 7.4 cm right upper neck mass with extensive surrounding inflammation most consistent with an abscess with this clinical history. Necrotic tumor is an alternative consideration. Electronically Signed   By: Sebastian Ache M.D.   On: 04/05/2021 16:26        Scheduled Meds:  famotidine  20 mg Oral Daily   ferrous sulfate  325 mg Oral Q breakfast   folic acid  1 mg Oral Daily   multivitamin with minerals  1 tablet Oral Daily   thiamine  100 mg Oral Daily   Continuous Infusions:  cefTRIAXone (ROCEPHIN)  IV Stopped (04/06/21 1429)   lactated ringers 50 mL/hr at 04/07/21 1130   vancomycin 1,250 mg (04/07/21 0332)     LOS: 1 day    Darlin Drop, MD [facility]   To contact the attending provider between 7A-7P or the covering provider during after hours 7P-7A, please log into the web site [email or web address] and access  using universal [place] password for that web site. If you do not have the password, please call the hospital operator.  04/07/2021, 1:27 PM

## 2021-04-08 LAB — CBC
HCT: 32.3 % — ABNORMAL LOW (ref 39.0–52.0)
Hemoglobin: 10.5 g/dL — ABNORMAL LOW (ref 13.0–17.0)
MCH: 22.6 pg — ABNORMAL LOW (ref 26.0–34.0)
MCHC: 32.5 g/dL (ref 30.0–36.0)
MCV: 69.6 fL — ABNORMAL LOW (ref 80.0–100.0)
Platelets: 587 10*3/uL — ABNORMAL HIGH (ref 150–400)
RBC: 4.64 MIL/uL (ref 4.22–5.81)
RDW: 12.9 % (ref 11.5–15.5)
WBC: 21.9 10*3/uL — ABNORMAL HIGH (ref 4.0–10.5)
nRBC: 0 % (ref 0.0–0.2)

## 2021-04-08 LAB — BASIC METABOLIC PANEL
Anion gap: 8 (ref 5–15)
BUN: 9 mg/dL (ref 6–20)
CO2: 27 mmol/L (ref 22–32)
Calcium: 8.9 mg/dL (ref 8.9–10.3)
Chloride: 97 mmol/L — ABNORMAL LOW (ref 98–111)
Creatinine, Ser: 0.9 mg/dL (ref 0.61–1.24)
GFR, Estimated: >60 mL/min (ref >60)
Glucose, Bld: 165 mg/dL — ABNORMAL HIGH (ref 70–99)
Potassium: 4 mmol/L (ref 3.5–5.1)
Sodium: 132 mmol/L — ABNORMAL LOW (ref 135–145)

## 2021-04-08 NOTE — Progress Notes (Addendum)
PROGRESS NOTE    Ernest Jenkins  FKC:[id number] DOB: 1981/04/05 DOA: 04/05/2021 PCP: Default, Provider, MD    Chief Complaint  Patient presents with   lump Rt side neck    Brief Narrative:  Ernest Jenkins is a 40 yo w/PMHx of EtOH abuse who presented w/right lateral neck mass and difficulty swallowing.  Work up revealed large right lateral neck abscess measuring 7.4 x 5.5 x 5.6 cm by CT scan.  Started on empiric IV antibiotics IV vancomycin and Rocephin, ENT was consulted and is following.  Due to poor response IV vancomycin and Rocephin was switched to IV clindamycin and IV Decadron was added on 04/07/2021.  Blood cultures have been negative to date.    04/08/2021: Patient was seen and examined at his bedside.  His symptomatology is much improved with IV clindamycin and IV Decadron.  States he is able to swallow a lot better without difficulty.   Assessment & Plan:   Principal Problem:   Neck abscess Active Problems:   Sepsis (HCC)   Hyponatremia   Microcytic anemia   Improving large right lateral Neck Abscess possibly secondary to lymphadenitis, poa CT: 7.4 cm right lateral wall neck mass with extensive surrounding inflammation most consistent with an abscess.  Possible lymphadenitis, clinically improved. IV vancomycin and Rocephin DC'd on 04/07/2021 due to poor response. Started on IV clindamycin and IV Decadron with improvement of symptomatology, continue Continue to monitor WBC and fever curve. Continue to follow blood cultures, negative to date. Continue pain management.  Resolved concern for dysphagia in the setting of large right lateral neck abscess Tolerate oral intake a lot better with IV clindamycin and IV Decadron. Continue aspiration precautions  Improving refractory hypovolemic Hyponatremia Suspect some degree of hypovolemia given poor PO intake prior to admit Continue to increase oral solute intake Serum sodium is improving, 132 from 129.  Chronic microcytic  anemia Low Fe Sat, suspect Ferritin elevated due to inflammation/infection. MCV low enough to suggest possible thalassemia trait. Continue replete w/PO iron Hemoglobin 10.5 from 10.9. No overt bleeding  Elevated AST in the setting of alcohol use disorder Rest of LFTs, within normal limits. Monitor for now  Hx of alcohol abuse Continue CIWA protocol Continue multivitamins, folic acid and thiamine supplements - famotidine for gastritis   DVT prophylaxis: SCDs given low padua score Code Status: full  Disposition:  Status is: Inpatient Remains inpatient appropriate because: medical necessity Can likely dc when ENT signs off.    Consultants:  ENT  Procedures: n/a  Antimicrobials:  Anti-infectives (From admission, onward)    Start     Dose/Rate Route Frequency Ordered Stop   04/07/21 1530  clindamycin (CLEOCIN) IVPB 300 mg  Status:  Discontinued        300 mg 100 mL/hr over 30 Minutes Intravenous Every 6 hours 04/07/21 1430 04/07/21 1435   04/07/21 1530  clindamycin (CLEOCIN) IVPB 600 mg        600 mg 100 mL/hr over 30 Minutes Intravenous Every 8 hours 04/07/21 1436     04/06/21 0300  vancomycin (VANCOREADY) IVPB 1250 mg/250 mL  Status:  Discontinued        1,250 mg 166.7 mL/hr over 90 Minutes Intravenous Every 12 hours 04/05/21 1421 04/07/21 1432   04/05/21 1430  vancomycin (VANCOREADY) IVPB 1750 mg/350 mL        1,750 mg 175 mL/hr over 120 Minutes Intravenous  Once 04/05/21 1418 04/05/21 1645   04/05/21 1400  cefTRIAXone (ROCEPHIN) 1 g in sodium chloride 0.9 %  100 mL IVPB  Status:  Discontinued        1 g 200 mL/hr over 30 Minutes Intravenous Every 24 hours 04/05/21 1352 04/07/21 1431           Objective: Vitals:   04/07/21 1734 04/07/21 2052 04/08/21 0456 04/08/21 1002  BP: 115/67 111/68 109/77 111/74  Pulse: 68 60 (!) 59 67  Resp: 18 18 18 17   Temp: 99.4 F (37.4 C) 98.1 F (36.7 C) (!) 97.5 F (36.4 C) 98.3 F (36.8 C)  TempSrc: Oral Oral Oral Oral   SpO2: 99% (!) 76% 94% 93%  Weight:      Height:        Intake/Output Summary (Last 24 hours) at 04/08/2021 1418 Last data filed at 04/08/2021 0800 Gross per 24 hour  Intake 941 ml  Output --  Net 941 ml   Filed Weights   04/05/21 1228 04/06/21 1451  Weight: 86.2 kg 72.8 kg    Examination:  General exam: Well-developed well-nourished in no acute distress.  He is alert and oriented x3.   HEENT: Right lateral neck edema much improved.  Tenderness also improved. Respiratory system: Clear to auscultation with no wheezes or rales.   Cardiovascular system: Regular rate and rhythm no rubs or gallops.   Gastrointestinal system: Nontender nondistended with normal bowel sounds present.   Central nervous system: Alert and oriented x3.  Moves all 4 extremities equally.   Extremities: No lower extremity edema bilaterally.   Skin: Erythema noted right lateral neck from abscess. Psychiatry: Mood is appropriate for condition and setting.  Data Reviewed: I have personally reviewed following labs and imaging studies  CBC: Recent Labs  Lab 04/05/21 1245 04/06/21 0400 04/07/21 0743 04/08/21 0350  WBC 20.1* 19.5* 22.5* 21.9*  NEUTROABS 16.9*  --   --   --   HGB 11.4* 10.2* 10.9* 10.5*  HCT 34.7* 31.0* 34.1* 32.3*  MCV 69.7* 70.0* 70.7* 69.6*  PLT 396 425* 545* 587*    Basic Metabolic Panel: Recent Labs  Lab 04/05/21 1245 04/06/21 0400 04/07/21 0743 04/08/21 0350  NA 129* 129* 129* 132*  K 4.3 3.8 3.9 4.0  CL 93* 96* 91* 97*  CO2 25 24 23 27   GLUCOSE 111* 110* 137* 165*  BUN 10 10 5* 9  CREATININE 1.15 1.05 0.86 0.90  CALCIUM 9.2 8.6* 8.3* 8.9    GFR: Estimated Creatinine Clearance: 110.1 mL/min (by C-G formula based on SCr of 0.9 mg/dL).  Liver Function Tests: Recent Labs  Lab 04/05/21 1245  AST 43*  ALT 40  ALKPHOS 63  BILITOT 0.8  PROT 7.8  ALBUMIN 3.1*    CBG: No results for input(s): GLUCAP in the last 168 hours.   Recent Results (from the past 240  hour(s))  Resp Panel by RT-PCR (Flu A&B, Covid) Nasopharyngeal Swab     Status: None   Collection Time: 04/05/21  1:55 PM   Specimen: Nasopharyngeal Swab; Nasopharyngeal(NP) swabs in vial transport medium  Result Value Ref Range Status   SARS Coronavirus 2 by RT PCR NEGATIVE NEGATIVE Final    Comment: (NOTE) SARS-CoV-2 target nucleic acids are NOT DETECTED.  The SARS-CoV-2 RNA is generally detectable in upper respiratory specimens during the acute phase of infection. The lowest concentration of SARS-CoV-2 viral copies this assay can detect is 138 copies/mL. A negative result does not preclude SARS-Cov-2 infection and should not be used as the sole basis for treatment or other patient management decisions. A negative result may occur with  improper specimen collection/handling, submission of specimen other than nasopharyngeal swab, presence of viral mutation(s) within the areas targeted by this assay, and inadequate number of viral copies(<138 copies/mL). A negative result must be combined with clinical observations, patient history, and epidemiological information. The expected result is Negative.  Fact Sheet for Patients:  BloggerCourse.com  Fact Sheet for Healthcare Providers:  SeriousBroker.it  This test is no t yet approved or cleared by the [place] FDA and  has been authorized for detection and/or diagnosis of SARS-CoV-2 by FDA under an Emergency Use Authorization (EUA). This EUA will remain  in effect (meaning this test can be used) for the duration of the COVID-19 declaration under Section 564(b)(1) of the Act, 21 U.S.C.section 360bbb-3(b)(1), unless the authorization is terminated  or revoked sooner.       Influenza A by PCR NEGATIVE NEGATIVE Final   Influenza B by PCR NEGATIVE NEGATIVE Final    Comment: (NOTE) The Xpert Xpress SARS-CoV-2/FLU/RSV plus assay is intended as an aid in the diagnosis of influenza from  Nasopharyngeal swab specimens and should not be used as a sole basis for treatment. Nasal washings and aspirates are unacceptable for Xpert Xpress SARS-CoV-2/FLU/RSV testing.  Fact Sheet for Patients: BloggerCourse.com  Fact Sheet for Healthcare Providers: SeriousBroker.it  This test is not yet approved or cleared by the [place] FDA and has been authorized for detection and/or diagnosis of SARS-CoV-2 by FDA under an Emergency Use Authorization (EUA). This EUA will remain in effect (meaning this test can be used) for the duration of the COVID-19 declaration under Section 564(b)(1) of the Act, 21 U.S.C. section 360bbb-3(b)(1), unless the authorization is terminated or revoked.  Performed at [facility], [address], [place], [place] [address]   Culture, blood (routine x 2)     Status: None (Preliminary result)   Collection Time: 04/05/21  2:11 PM   Specimen: BLOOD  Result Value Ref Range Status   Specimen Description BLOOD SITE NOT SPECIFIED  Final   Special Requests   Final    BOTTLES DRAWN AEROBIC AND ANAEROBIC Blood Culture results may not be optimal due to an inadequate volume of blood received in culture bottles   Culture   Final    NO GROWTH 3 DAYS Performed at [facility], [address] [place], [place] [address]    Report Status PENDING  Incomplete  Culture, blood (routine x 2)     Status: None (Preliminary result)   Collection Time: 04/05/21  2:44 PM   Specimen: BLOOD  Result Value Ref Range Status   Specimen Description BLOOD RIGHT ANTECUBITAL  Final   Special Requests   Final    BOTTLES DRAWN AEROBIC AND ANAEROBIC Blood Culture adequate volume   Culture   Final    NO GROWTH 3 DAYS Performed at [facility], [address], [place], [place] [address]    Report Status PENDING  Incomplete         Radiology Studies: No results found.      Scheduled Meds:  dexamethasone  (DECADRON) injection  10 mg Intravenous Q8H   famotidine  20 mg Oral Daily   feeding supplement  237 mL Oral BID BM   ferrous sulfate  325 mg Oral Q breakfast   folic acid  1 mg Oral Daily   multivitamin with minerals  1 tablet Oral Daily   thiamine  100 mg Oral Daily   Continuous Infusions:  clindamycin (CLEOCIN) IV 600 mg (04/08/21 1330)  lactated ringers 50 mL/hr at 04/08/21 3967     LOS: 2 days    Darlin Drop, MD [facility]   To contact the attending provider between 7A-7P or the covering provider during after hours 7P-7A, please log into the web site [email or web address] and access using universal [place] password for that web site. If you do not have the password, please call the hospital operator.  04/08/2021, 2:18 PM

## 2021-04-08 NOTE — Progress Notes (Signed)
Subjective: Pt reports improvement in his neck pain and dysphagia. Tolerated dinner last night.  Objective: Vital signs in last 24 hours: Temp:  [98.1 F (36.7 C)-100 F (37.8 C)] 98.1 F (36.7 C) (11/26 2052) Pulse Rate:  [60-81] 60 (11/26 2052) Resp:  [16-18] 18 (11/26 2052) BP: (111-115)/(66-69) 111/68 (11/26 2052) SpO2:  [76 %-100 %] 76 % (11/26 2052)  Physical Exam: General appearance: alert, cooperative, and appears stated age Head: Normocephalic, without obvious abnormality, atraumatic Eyes: Pupils are equal, round, reactive to light. Extraocular motion is intact.  Ears: Examination of the ears shows normal auricles and external auditory canals bilaterally.   Nose: Nasal examination shows normal mucosa, septum, turbinates.  Face: Facial examination shows no asymmetry. Palpation of the face elicit no significant tenderness.  Mouth: Oral cavity examination shows no mucosal abnormalities. No significant trismus is noted.  Neck: Palpation of the neck reveals a large tender right neck mass, with surface erythema. Neuro: Cranial nerves 2-12 are all grossly in tact.   Recent Labs    04/06/21 0400 04/07/21 0743  WBC 19.5* 22.5*  HGB 10.2* 10.9*  HCT 31.0* 34.1*  PLT 425* 545*   Recent Labs    04/06/21 0400 04/07/21 0743  NA 129* 129*  K 3.8 3.9  CL 96* 91*  CO2 24 23  GLUCOSE 110* 137*  BUN 10 5*  CREATININE 1.05 0.86  CALCIUM 8.6* 8.3*    Medications: I have reviewed the patient's current medications. Scheduled:  dexamethasone (DECADRON) injection  10 mg Intravenous Q8H   famotidine  20 mg Oral Daily   feeding supplement  237 mL Oral BID BM   ferrous sulfate  325 mg Oral Q breakfast   folic acid  1 mg Oral Daily   multivitamin with minerals  1 tablet Oral Daily   thiamine  100 mg Oral Daily   Continuous:  clindamycin (CLEOCIN) IV 600 mg (04/07/21 2210)   lactated ringers 50 mL/hr at 04/07/21 1130   EPP:IRJJOACZYSAYT, ibuprofen, ketorolac, LORazepam **OR**  LORazepam, oxyCODONE  Assessment/Plan: Right neck abscess, possible lymphadenitis. Clinically improved. - Continue with IV clindamycin and decadron. - Dysphagia has resolved.   LOS: 2 days   [place] Friedhoff W Hank Walling 04/08/2021, 3:49 AM

## 2021-04-09 LAB — CBC
HCT: 29.2 % — ABNORMAL LOW (ref 39.0–52.0)
Hemoglobin: 9.3 g/dL — ABNORMAL LOW (ref 13.0–17.0)
MCH: 22.5 pg — ABNORMAL LOW (ref 26.0–34.0)
MCHC: 31.8 g/dL (ref 30.0–36.0)
MCV: 70.5 fL — ABNORMAL LOW (ref 80.0–100.0)
Platelets: 709 10*3/uL — ABNORMAL HIGH (ref 150–400)
RBC: 4.14 MIL/uL — ABNORMAL LOW (ref 4.22–5.81)
RDW: 13.2 % (ref 11.5–15.5)
WBC: 26.1 10*3/uL — ABNORMAL HIGH (ref 4.0–10.5)
nRBC: 0 % (ref 0.0–0.2)

## 2021-04-09 LAB — BASIC METABOLIC PANEL
Anion gap: 8 (ref 5–15)
BUN: 15 mg/dL (ref 6–20)
CO2: 25 mmol/L (ref 22–32)
Calcium: 9.1 mg/dL (ref 8.9–10.3)
Chloride: 100 mmol/L (ref 98–111)
Creatinine, Ser: 0.82 mg/dL (ref 0.61–1.24)
GFR, Estimated: >60 mL/min (ref >60)
Glucose, Bld: 149 mg/dL — ABNORMAL HIGH (ref 70–99)
Potassium: 4.4 mmol/L (ref 3.5–5.1)
Sodium: 133 mmol/L — ABNORMAL LOW (ref 135–145)

## 2021-04-09 LAB — GLUCOSE, CAPILLARY
Glucose-Capillary: 142 mg/dL — ABNORMAL HIGH (ref 70–99)
Glucose-Capillary: 150 mg/dL — ABNORMAL HIGH (ref 70–99)
Glucose-Capillary: 190 mg/dL — ABNORMAL HIGH (ref 70–99)
Glucose-Capillary: 142 mg/dL — ABNORMAL HIGH (ref 70–99)

## 2021-04-09 LAB — HEMOGLOBIN A1C
Hgb A1c MFr Bld: 5.2 % (ref 4.8–5.6)
Mean Plasma Glucose: 103 mg/dL

## 2021-04-09 MED ORDER — INSULIN ASPART 100 UNIT/ML IJ SOLN
0.0000 [IU] | Freq: Three times a day (TID) | INTRAMUSCULAR | Status: DC
  Administered 2021-04-09 (×2): 1 [IU] via SUBCUTANEOUS
  Administered 2021-04-09: 2 [IU] via SUBCUTANEOUS
  Administered 2021-04-10 (×2): 1 [IU] via SUBCUTANEOUS
  Administered 2021-04-10: 2 [IU] via SUBCUTANEOUS
  Administered 2021-04-11: 1 [IU] via SUBCUTANEOUS
  Administered 2021-04-11: 3 [IU] via SUBCUTANEOUS

## 2021-04-09 MED ORDER — INSULIN ASPART 100 UNIT/ML IJ SOLN: 0.0000 [IU] | Freq: Every day | INTRAMUSCULAR | Status: DC

## 2021-04-09 MED ORDER — ENOXAPARIN SODIUM 40 MG/0.4ML IJ SOSY
40.0000 mg | PREFILLED_SYRINGE | INTRAMUSCULAR | Status: DC
  Administered 2021-04-09 – 2021-04-11 (×3): 40 mg via SUBCUTANEOUS
  Filled 2021-04-09 (×4): qty 0.4

## 2021-04-09 NOTE — Progress Notes (Signed)
Subjective: Pt reports feeling better. Tolerating oral intake well  Objective: Vital signs in last 24 hours: Temp:  [97.5 F (36.4 C)-98.3 F (36.8 C)] 97.5 F (36.4 C) (11/28 0545) Pulse Rate:  [52-67] 52 (11/28 0545) Resp:  [16-19] 16 (11/28 0545) BP: (105-117)/(62-74) 105/71 (11/28 0545) SpO2:  [93 %-100 %] 100 % (11/28 0545)  Physical Exam: General appearance: alert, cooperative, and appears stated age Head: Normocephalic, without obvious abnormality, atraumatic Eyes: Pupils are equal, round, reactive to light. Extraocular motion is intact.  Ears: Examination of the ears shows normal auricles and external auditory canals bilaterally.   Nose: Nasal examination shows normal mucosa, septum, turbinates.  Face: Facial examination shows no asymmetry. Palpation of the face elicit no significant tenderness.  Mouth: Oral cavity examination shows no mucosal abnormalities. No significant trismus is noted.  Neck: Palpation of the neck reveals a large right neck mass, with palpable fluctuance. Neuro: Cranial nerves 2-12 are all grossly in tact.   Recent Labs    04/08/21 0350 04/09/21 0411  WBC 21.9* 26.1*  HGB 10.5* 9.3*  HCT 32.3* 29.2*  PLT 587* 709*   Recent Labs    04/08/21 0350 04/09/21 0411  NA 132* 133*  K 4.0 4.4  CL 97* 100  CO2 27 25  GLUCOSE 165* 149*  BUN 9 15  CREATININE 0.90 0.82  CALCIUM 8.9 9.1    Medications: I have reviewed the patient's current medications. Scheduled:  dexamethasone (DECADRON) injection  10 mg Intravenous Q8H   enoxaparin (LOVENOX) injection  40 mg Subcutaneous Q24H   famotidine  20 mg Oral Daily   feeding supplement  237 mL Oral BID BM   ferrous sulfate  325 mg Oral Q breakfast   folic acid  1 mg Oral Daily   insulin aspart  0-5 Units Subcutaneous QHS   insulin aspart  0-9 Units Subcutaneous TID WC   multivitamin with minerals  1 tablet Oral Daily   thiamine  100 mg Oral Daily   Continuous:  clindamycin (CLEOCIN) IV 600 mg  (04/09/21 0435)   lactated ringers 50 mL/hr at 04/09/21 0432   VHQ:[id number], ibuprofen, ketorolac, oxyCODONE  Assessment/Plan: Right neck abscess, possible lymphadenitis. Clinically improved. - Continue with IV clindamycin. - Elevated WBC likely a result of decadron. - May d/c decadron. - Dysphagia has resolved. - Consider switching to oral clindamycin 300mg  po QID. - If continues to do well tomorrow, may d/c home with oral clindamycin.  - Will attempt drainage of abscess at bedside.   LOS: 3 days   Audray Rumore W Takeisha Cianci 04/09/2021, 7:07 AM

## 2021-04-09 NOTE — Progress Notes (Signed)
PROGRESS NOTE    Ernest Jenkins  JOI:[id number] DOB: [birth year] DOA: 04/05/2021 PCP: Default, Provider, MD    Chief Complaint  Patient presents with   lump Rt side neck    Brief Narrative:  Ernest Jenkins is a 40 yo w/PMHx of EtOH abuse who presented w/right lateral neck mass and difficulty swallowing.  Work up revealed large right lateral neck abscess measuring 7.4 x 5.5 x 5.6 cm by CT scan.  Started on empiric IV antibiotics IV vancomycin and Rocephin, ENT was consulted and is following.  Due to poor response IV vancomycin and Rocephin was switched to IV clindamycin and IV Decadron was added on 04/07/2021.  Blood cultures have been negative to date.    04/09/2021: Patient was seen and examined at his bedside.  Still having quite a bit of pain in his right lateral neck.  Swelling has decreased on IV Decadron and IV clindamycin   Assessment & Plan:   Principal Problem:   Neck abscess Active Problems:   Sepsis (HCC)   Hyponatremia   Microcytic anemia   Improving large right lateral Neck Abscess possibly secondary to lymphadenitis, poa CT: 7.4 cm right lateral wall neck mass with extensive surrounding inflammation most consistent with an abscess.  Possible lymphadenitis, clinically improved. IV vancomycin and Rocephin DC'd on 04/07/2021 due to poor response. Started on IV clindamycin and IV Decadron with improvement of symptomatology, continue Continue to monitor WBC and fever curve. Continue to follow blood cultures, negative to date. Continue pain management  Resolved concern for dysphagia in the setting of large right lateral neck abscess Tolerate oral intake a lot better with IV clindamycin and IV Decadron. Continue aspiration precautions  Improving refractory hypovolemic Hyponatremia Suspect some degree of hypovolemia given poor PO intake prior to admit Continue to increase oral solute intake Serum sodium is improving, 132 from 129.  Chronic microcytic anemia Low Fe Sat,  suspect Ferritin elevated due to inflammation/infection. MCV low enough to suggest possible thalassemia trait. Continue replete w/PO iron Hemoglobin 10.5 from 10.9. No overt bleeding  Elevated AST in the setting of alcohol use disorder Rest of LFTs, within normal limits. Monitor for now  Hx of alcohol abuse Continue CIWA protocol Continue multivitamins, folic acid and thiamine supplements - famotidine for gastritis   DVT prophylaxis: SCDs given low padua score Code Status: full  Disposition:  Status is: Inpatient Remains inpatient appropriate because: medical necessity Can likely dc when ENT signs off.    Consultants:  ENT  Procedures: n/a  Antimicrobials:  Anti-infectives (From admission, onward)    Start     Dose/Rate Route Frequency Ordered Stop   04/07/21 1530  clindamycin (CLEOCIN) IVPB 300 mg  Status:  Discontinued        300 mg 100 mL/hr over 30 Minutes Intravenous Every 6 hours 04/07/21 1430 04/07/21 1435   04/07/21 1530  clindamycin (CLEOCIN) IVPB 600 mg        600 mg 100 mL/hr over 30 Minutes Intravenous Every 8 hours 04/07/21 1436     04/06/21 0300  vancomycin (VANCOREADY) IVPB 1250 mg/250 mL  Status:  Discontinued        1,250 mg 166.7 mL/hr over 90 Minutes Intravenous Every 12 hours 04/05/21 1421 04/07/21 1432   04/05/21 1430  vancomycin (VANCOREADY) IVPB 1750 mg/350 mL        1,750 mg 175 mL/hr over 120 Minutes Intravenous  Once 04/05/21 1418 04/05/21 1645   04/05/21 1400  cefTRIAXone (ROCEPHIN) 1 g in sodium chloride 0.9 % 100  mL IVPB  Status:  Discontinued        1 g 200 mL/hr over 30 Minutes Intravenous Every 24 hours 04/05/21 1352 04/07/21 1431           Objective: Vitals:   04/08/21 2134 04/09/21 0545 04/09/21 0804 04/09/21 1624  BP: 112/62 105/71 109/67 116/66  Pulse: (!) 54 (!) 52 60 (!) 50  Resp: 18 16 18 20   Temp: 98.2 F (36.8 C) (!) 97.5 F (36.4 C) 98.3 F (36.8 C) 98.1 F (36.7 C)  TempSrc: Oral Oral Oral Oral  SpO2: 100%  100% 100% 100%  Weight:      Height:        Intake/Output Summary (Last 24 hours) at 04/09/2021 1739 Last data filed at 04/09/2021 1515 Gross per 24 hour  Intake 970 ml  Output --  Net 970 ml   Filed Weights   04/05/21 1228 04/06/21 1451  Weight: 86.2 kg 72.8 kg    Examination: No significant changes from prior exam.  General exam: Well-developed well-nourished in no acute distress.  He is alert and oriented x3.   HEENT: Right lateral neck edema much improved.  Tenderness also improved. Respiratory system: Clear to auscultation with no wheezes or rales.   Cardiovascular system: Regular rate and rhythm no rubs or gallops.   Gastrointestinal system: Nontender nondistended with normal bowel sounds present.   Central nervous system: Alert and oriented x3.  Moves all 4 extremities equally.   Extremities: No lower extremity edema bilaterally.   Skin: Erythema noted right lateral neck from abscess. Psychiatry: Mood is appropriate for condition and setting.  Data Reviewed: I have personally reviewed following labs and imaging studies  CBC: Recent Labs  Lab 04/05/21 1245 04/06/21 0400 04/07/21 0743 04/08/21 0350 04/09/21 0411  WBC 20.1* 19.5* 22.5* 21.9* 26.1*  NEUTROABS 16.9*  --   --   --   --   HGB 11.4* 10.2* 10.9* 10.5* 9.3*  HCT 34.7* 31.0* 34.1* 32.3* 29.2*  MCV 69.7* 70.0* 70.7* 69.6* 70.5*  PLT 396 425* 545* 587* 709*    Basic Metabolic Panel: Recent Labs  Lab 04/05/21 1245 04/06/21 0400 04/07/21 0743 04/08/21 0350 04/09/21 0411  NA 129* 129* 129* 132* 133*  K 4.3 3.8 3.9 4.0 4.4  CL 93* 96* 91* 97* 100  CO2 25 24 23 27 25   GLUCOSE 111* 110* 137* 165* 149*  BUN 10 10 5* 9 15  CREATININE 1.15 1.05 0.86 0.90 0.82  CALCIUM 9.2 8.6* 8.3* 8.9 9.1    GFR: Estimated Creatinine Clearance: 120.8 mL/min (by C-G formula based on SCr of 0.82 mg/dL).  Liver Function Tests: Recent Labs  Lab 04/05/21 1245  AST 43*  ALT 40  ALKPHOS 63  BILITOT 0.8  PROT 7.8   ALBUMIN 3.1*    CBG: Recent Labs  Lab 04/09/21 0725 04/09/21 1149 04/09/21 1631  GLUCAP 142* 150* 190*     Recent Results (from the past 240 hour(s))  Resp Panel by RT-PCR (Flu A&B, Covid) Nasopharyngeal Swab     Status: None   Collection Time: 04/05/21  1:55 PM   Specimen: Nasopharyngeal Swab; Nasopharyngeal(NP) swabs in vial transport medium  Result Value Ref Range Status   SARS Coronavirus 2 by RT PCR NEGATIVE NEGATIVE Final    Comment: (NOTE) SARS-CoV-2 target nucleic acids are NOT DETECTED.  The SARS-CoV-2 RNA is generally detectable in upper respiratory specimens during the acute phase of infection. The lowest concentration of SARS-CoV-2 viral copies this assay can detect is  138 copies/mL. A negative result does not preclude SARS-Cov-2 infection and should not be used as the sole basis for treatment or other patient management decisions. A negative result may occur with  improper specimen collection/handling, submission of specimen other than nasopharyngeal swab, presence of viral mutation(s) within the areas targeted by this assay, and inadequate number of viral copies(<138 copies/mL). A negative result must be combined with clinical observations, patient history, and epidemiological information. The expected result is Negative.  Fact Sheet for Patients:  BloggerCourse.com  Fact Sheet for Healthcare Providers:  SeriousBroker.it  This test is no t yet approved or cleared by the [place] FDA and  has been authorized for detection and/or diagnosis of SARS-CoV-2 by FDA under an Emergency Use Authorization (EUA). This EUA will remain  in effect (meaning this test can be used) for the duration of the COVID-19 declaration under Section 564(b)(1) of the Act, 21 U.S.C.section 360bbb-3(b)(1), unless the authorization is terminated  or revoked sooner.       Influenza A by PCR NEGATIVE NEGATIVE Final   Influenza B  by PCR NEGATIVE NEGATIVE Final    Comment: (NOTE) The Xpert Xpress SARS-CoV-2/FLU/RSV plus assay is intended as an aid in the diagnosis of influenza from Nasopharyngeal swab specimens and should not be used as a sole basis for treatment. Nasal washings and aspirates are unacceptable for Xpert Xpress SARS-CoV-2/FLU/RSV testing.  Fact Sheet for Patients: BloggerCourse.com  Fact Sheet for Healthcare Providers: SeriousBroker.it  This test is not yet approved or cleared by the [place] FDA and has been authorized for detection and/or diagnosis of SARS-CoV-2 by FDA under an Emergency Use Authorization (EUA). This EUA will remain in effect (meaning this test can be used) for the duration of the COVID-19 declaration under Section 564(b)(1) of the Act, 21 U.S.C. section 360bbb-3(b)(1), unless the authorization is terminated or revoked.  Performed at [facility], [address], [place], [place] [address]   Culture, blood (routine x 2)     Status: None (Preliminary result)   Collection Time: 04/05/21  2:11 PM   Specimen: BLOOD  Result Value Ref Range Status   Specimen Description BLOOD SITE NOT SPECIFIED  Final   Special Requests   Final    BOTTLES DRAWN AEROBIC AND ANAEROBIC Blood Culture results may not be optimal due to an inadequate volume of blood received in culture bottles   Culture   Final    NO GROWTH 4 DAYS Performed at [facility], [address] [place], [place] [address]    Report Status PENDING  Incomplete  Culture, blood (routine x 2)     Status: None (Preliminary result)   Collection Time: 04/05/21  2:44 PM   Specimen: BLOOD  Result Value Ref Range Status   Specimen Description BLOOD RIGHT ANTECUBITAL  Final   Special Requests   Final    BOTTLES DRAWN AEROBIC AND ANAEROBIC Blood Culture adequate volume   Culture   Final    NO GROWTH 4 DAYS Performed at [facility], [address],  [place], [place] [address]    Report Status PENDING  Incomplete         Radiology Studies: No results found.      Scheduled Meds:  dexamethasone (DECADRON) injection  10 mg Intravenous Q8H   enoxaparin (LOVENOX) injection  40 mg Subcutaneous Q24H   famotidine  20 mg Oral Daily   feeding supplement  237 mL Oral BID BM   ferrous sulfate  325  mg Oral Q breakfast   folic acid  1 mg Oral Daily   insulin aspart  0-5 Units Subcutaneous QHS   insulin aspart  0-9 Units Subcutaneous TID WC   multivitamin with minerals  1 tablet Oral Daily   thiamine  100 mg Oral Daily   Continuous Infusions:  clindamycin (CLEOCIN) IV Stopped (04/09/21 1344)     LOS: 3 days    Darlin Drop, MD [facility]   To contact the attending provider between 7A-7P or the covering provider during after hours 7P-7A, please log into the web site [email or web address] and access using universal [place] password for that web site. If you do not have the password, please call the hospital operator.  04/09/2021, 5:39 PM

## 2021-04-09 NOTE — Plan of Care (Signed)
  Problem: Education: Goal: Knowledge of General Education information will improve Description: Including pain rating scale, medication(s)/side effects and non-pharmacologic comfort measures Outcome: Completed/Met

## 2021-04-10 LAB — CULTURE, BLOOD (ROUTINE X 2)
Culture: NO GROWTH
Culture: NO GROWTH
Special Requests: ADEQUATE

## 2021-04-10 LAB — GLUCOSE, CAPILLARY
Glucose-Capillary: 155 mg/dL — ABNORMAL HIGH (ref 70–99)
Glucose-Capillary: 149 mg/dL — ABNORMAL HIGH (ref 70–99)
Glucose-Capillary: 189 mg/dL — ABNORMAL HIGH (ref 70–99)
Glucose-Capillary: 140 mg/dL — ABNORMAL HIGH (ref 70–99)

## 2021-04-10 LAB — HGB FRACTIONATION CASCADE
Hgb A2: 2.2 % (ref 1.8–3.2)
Hgb A: 97.8 % (ref 96.4–98.8)
Hgb F: 0 % (ref 0.0–2.0)
Hgb S: 0 %

## 2021-04-10 MED ORDER — BISACODYL 10 MG RE SUPP: 10.0000 mg | Freq: Once | RECTAL | Status: DC

## 2021-04-10 MED ORDER — BISACODYL 10 MG RE SUPP
RECTAL | Status: AC
  Filled 2021-04-10: qty 1

## 2021-04-10 MED ORDER — SENNOSIDES-DOCUSATE SODIUM 8.6-50 MG PO TABS
2.0000 | ORAL_TABLET | Freq: Two times a day (BID) | ORAL | Status: DC
  Administered 2021-04-10 – 2021-04-11 (×2): 2 via ORAL
  Filled 2021-04-10: qty 2

## 2021-04-10 MED ORDER — SENNOSIDES-DOCUSATE SODIUM 8.6-50 MG PO TABS
ORAL_TABLET | ORAL | Status: AC
  Filled 2021-04-10: qty 2

## 2021-04-10 NOTE — Progress Notes (Signed)
Subjective: Neck pain and swelling continues to improve. No issues overnight.  Objective: Vital signs in last 24 hours: Temp:  [97.9 F (36.6 C)-98.6 F (37 C)] 97.9 F (36.6 C) (11/29 0447) Pulse Rate:  [48-60] 51 (11/29 0447) Resp:  [18-20] 18 (11/29 0447) BP: (109-119)/(66-75) 119/74 (11/29 0447) SpO2:  [100 %] 100 % (11/29 0447)  Physical Exam: General appearance: alert, cooperative, and appears stated age Head: Normocephalic, without obvious abnormality, atraumatic Eyes: Pupils are equal, round, reactive to light. Extraocular motion is intact.  Ears: Examination of the ears shows normal auricles and external auditory canals bilaterally.   Nose: Nasal examination shows normal mucosa, septum, turbinates.  Face: Facial examination shows no asymmetry. Palpation of the face elicit no significant tenderness.  Mouth: Oral cavity examination shows no mucosal abnormalities. No significant trismus is noted.  Neck: Palpation of the neck reveals a large right neck mass, with palpable fluctuance. Neuro: Cranial nerves 2-12 are all grossly in tact.   Recent Labs    04/08/21 0350 04/09/21 0411  WBC 21.9* 26.1*  HGB 10.5* 9.3*  HCT 32.3* 29.2*  PLT 587* 709*   Recent Labs    04/08/21 0350 04/09/21 0411  NA 132* 133*  K 4.0 4.4  CL 97* 100  CO2 27 25  GLUCOSE 165* 149*  BUN 9 15  CREATININE 0.90 0.82  CALCIUM 8.9 9.1    Medications: I have reviewed the patient's current medications. Scheduled:  dexamethasone (DECADRON) injection  10 mg Intravenous Q8H   enoxaparin (LOVENOX) injection  40 mg Subcutaneous Q24H   famotidine  20 mg Oral Daily   feeding supplement  237 mL Oral BID BM   ferrous sulfate  325 mg Oral Q breakfast   folic acid  1 mg Oral Daily   insulin aspart  0-5 Units Subcutaneous QHS   insulin aspart  0-9 Units Subcutaneous TID WC   multivitamin with minerals  1 tablet Oral Daily   thiamine  100 mg Oral Daily   Continuous:  clindamycin (CLEOCIN) IV 600 mg  (04/10/21 0500)   Procedure: Drainage of the right neck abscess.   Anesthesia: local anesthesia with 1% lidocaine with epinephrine.   Description: The patient is placed upright on the hospital bed.  An 18-G needle is used to make multiple passes through the right neck abscess.  Approximately 10 cc of purulent fluid was evacuated. The patient tolerated the procedure well.     Assessment/Plan: Right neck abscess, possible lymphadenitis. Clinically improved.  - Dysphagia has resolved. - May d/c home with oral clindamycin 300mg  QID for 2 weeks. - Pt may follow up with me as an outpatient in 2 weeks.   LOS: 4 days   Lovett Coffin W Sam Overbeck 04/10/2021, 7:04 AM

## 2021-04-10 NOTE — Progress Notes (Signed)
PROGRESS NOTE    Ernest Jenkins  ZOX:[id number] DOB: 04/07/1981 DOA: 04/05/2021 PCP: Default, Provider, MD    Chief Complaint  Patient presents with   lump Rt side neck    Brief Narrative:  Ernest Jenkins is a 40 yo w/PMHx of EtOH abuse who presented w/right lateral neck mass and difficulty swallowing.  Work up revealed large right lateral neck abscess measuring 7.4 x 5.5 x 5.6 cm by CT scan.  Started on empiric IV antibiotics IV vancomycin and Rocephin, ENT was consulted and followed.  Due to poor response IV vancomycin and Rocephin, antibiotics were switched to IV clindamycin and IV Decadron was added on 04/07/2021.  Blood cultures have been negative to date.  His symptomatology improved with IV clindamycin and IV Decadron.  Drainage of the right neck abscess on 04/10/2021 by ENT.  Approximately 10 cc of purulent fluid was evacuated.  Per ENT can discharge to home with oral clindamycin 300 mg 4 times daily for 2 weeks and follow-up with Dr. Suszanne Conners outpatient in 2 weeks.  04/10/2021: Patient was seen and examined at bedside.  Endorses pain in his right lateral neck, improved with narcotics.  He is on IV clindamycin and IV Decadron, will benefit from another day of IV therapy.  Will switch to oral antibiotics as recommended by ENT tomorrow for plan to DC on 04/11/2021.   Assessment & Plan:   Principal Problem:   Neck abscess Active Problems:   Sepsis (HCC)   Hyponatremia   Microcytic anemia   Improving large right lateral Neck Abscess possibly secondary to lymphadenitis, poa CT: 7.4 cm right lateral wall neck mass with extensive surrounding inflammation most consistent with an abscess.  Possible lymphadenitis, clinically improved. IV vancomycin and Rocephin DC'd on 04/07/2021 due to poor response. Started on IV clindamycin and IV Decadron with improvement of symptomatology on 04/07/2021, continue Drainage of abscess done on 04/10/2021 by Dr. Suszanne Conners, with 10 cc of purulent fluid  removed. Continue to follow cultures Switch to oral therapy 04/11/2021, as stated above, recommended by ENT Repeat CBC in the morning  Resolved concern for dysphagia in the setting of large right lateral neck abscess Tolerate oral intake a lot better with IV clindamycin and IV Decadron. Continue aspiration precautions  Improving refractory hypovolemic Hyponatremia Suspect some degree of hypovolemia given poor PO intake prior to admit Continue to increase oral solute intake Serum sodium is improving, 133 from 132 from 129. Repeat BMP in the morning  Chronic microcytic anemia Low Fe Sat, suspect Ferritin elevated due to inflammation/infection.  Is currently on oral iron supplement. Hemoglobin 9.3 from 10.5 from 10.9. MCV 70.5 Repeat CBC in the morning.  Thrombocytosis, suspect reactive Platelet count 709 Monitor we will repeat a CBC  Elevated AST in the setting of alcohol use disorder Rest of LFTs, within normal limits. Monitor for now  Hx of alcohol abuse CIWA protocol Out of window for withdrawal. No evidence of alcohol withdrawal at time of the visit. Continue multivitamins, folic acid and thiamine supplements Continue famotidine for gastritis   DVT prophylaxis: Subcu Lovenox daily. Code Status: full code  Disposition:  Status is: Inpatient Remains inpatient appropriate because: medical necessity Can likely dc on 04/11/2021.   Consultants:  ENT  Procedures: n/a  Antimicrobials:  Anti-infectives (From admission, onward)    Start     Dose/Rate Route Frequency Ordered Stop   04/07/21 1530  clindamycin (CLEOCIN) IVPB 300 mg  Status:  Discontinued        300 mg 100 mL/hr  over 30 Minutes Intravenous Every 6 hours 04/07/21 1430 04/07/21 1435   04/07/21 1530  clindamycin (CLEOCIN) IVPB 600 mg        600 mg 100 mL/hr over 30 Minutes Intravenous Every 8 hours 04/07/21 1436     04/06/21 0300  vancomycin (VANCOREADY) IVPB 1250 mg/250 mL  Status:  Discontinued         1,250 mg 166.7 mL/hr over 90 Minutes Intravenous Every 12 hours 04/05/21 1421 04/07/21 1432   04/05/21 1430  vancomycin (VANCOREADY) IVPB 1750 mg/350 mL        1,750 mg 175 mL/hr over 120 Minutes Intravenous  Once 04/05/21 1418 04/05/21 1645   04/05/21 1400  cefTRIAXone (ROCEPHIN) 1 g in sodium chloride 0.9 % 100 mL IVPB  Status:  Discontinued        1 g 200 mL/hr over 30 Minutes Intravenous Every 24 hours 04/05/21 1352 04/07/21 1431           Objective: Vitals:   04/09/21 1624 04/09/21 2059 04/10/21 0447 04/10/21 1120  BP: 116/66 113/75 119/74 111/68  Pulse: (!) 50 (!) 48 (!) 51 (!) 52  Resp: 20  18 18   Temp: 98.1 F (36.7 C) 98.6 F (37 C) 97.9 F (36.6 C) 97.9 F (36.6 C)  TempSrc: Oral Oral Oral   SpO2: 100% 100% 100% 100%  Weight:      Height:        Intake/Output Summary (Last 24 hours) at 04/10/2021 1521 Last data filed at 04/10/2021 0600 Gross per 24 hour  Intake 520 ml  Output 0 ml  Net 520 ml   Filed Weights   04/05/21 1228 04/06/21 1451  Weight: 86.2 kg 72.8 kg    Examination:  General exam: Well-developed well-nourished in no distress.  He is alert and oriented x3.   HEENT: Right lateral neck edema present, significant, but improved.   Respiratory system: Clear to auscultation with no wheezes or rales.   Cardiovascular system: Regular rate and rhythm no rubs or gallops. Gastrointestinal system: Nontender nondistended normal bowel sounds present.   Central nervous system: Alert and oriented x3.  Nonfocal exam.   Extremities: No lower extremity edema bilaterally. Skin: Mild erythema noted on right lateral neck.  Edema present. Psychiatry: Mood is appropriate for condition and setting.  Data Reviewed: I have personally reviewed following labs and imaging studies  CBC: Recent Labs  Lab 04/05/21 1245 04/06/21 0400 04/07/21 0743 04/08/21 0350 04/09/21 0411  WBC 20.1* 19.5* 22.5* 21.9* 26.1*  NEUTROABS 16.9*  --   --   --   --   HGB 11.4* 10.2*  10.9* 10.5* 9.3*  HCT 34.7* 31.0* 34.1* 32.3* 29.2*  MCV 69.7* 70.0* 70.7* 69.6* 70.5*  PLT 396 425* 545* 587* 709*    Basic Metabolic Panel: Recent Labs  Lab 04/05/21 1245 04/06/21 0400 04/07/21 0743 04/08/21 0350 04/09/21 0411  NA 129* 129* 129* 132* 133*  K 4.3 3.8 3.9 4.0 4.4  CL 93* 96* 91* 97* 100  CO2 25 24 23 27 25   GLUCOSE 111* 110* 137* 165* 149*  BUN 10 10 5* 9 15  CREATININE 1.15 1.05 0.86 0.90 0.82  CALCIUM 9.2 8.6* 8.3* 8.9 9.1    GFR: Estimated Creatinine Clearance: 120.8 mL/min (by C-G formula based on SCr of 0.82 mg/dL).  Liver Function Tests: Recent Labs  Lab 04/05/21 1245  AST 43*  ALT 40  ALKPHOS 63  BILITOT 0.8  PROT 7.8  ALBUMIN 3.1*    CBG: Recent Labs  Lab 04/09/21 1149 04/09/21 1631 04/09/21 2058 04/10/21 0644 04/10/21 1120  GLUCAP 150* 190* 142* 155* 149*     Recent Results (from the past 240 hour(s))  Resp Panel by RT-PCR (Flu A&B, Covid) Nasopharyngeal Swab     Status: None   Collection Time: 04/05/21  1:55 PM   Specimen: Nasopharyngeal Swab; Nasopharyngeal(NP) swabs in vial transport medium  Result Value Ref Range Status   SARS Coronavirus 2 by RT PCR NEGATIVE NEGATIVE Final    Comment: (NOTE) SARS-CoV-2 target nucleic acids are NOT DETECTED.  The SARS-CoV-2 RNA is generally detectable in upper respiratory specimens during the acute phase of infection. The lowest concentration of SARS-CoV-2 viral copies this assay can detect is 138 copies/mL. A negative result does not preclude SARS-Cov-2 infection and should not be used as the sole basis for treatment or other patient management decisions. A negative result may occur with  improper specimen collection/handling, submission of specimen other than nasopharyngeal swab, presence of viral mutation(s) within the areas targeted by this assay, and inadequate number of viral copies(<138 copies/mL). A negative result must be combined with clinical observations, patient history,  and epidemiological information. The expected result is Negative.  Fact Sheet for Patients:  BloggerCourse.com  Fact Sheet for Healthcare Providers:  SeriousBroker.it  This test is no t yet approved or cleared by the [place] FDA and  has been authorized for detection and/or diagnosis of SARS-CoV-2 by FDA under an Emergency Use Authorization (EUA). This EUA will remain  in effect (meaning this test can be used) for the duration of the COVID-19 declaration under Section 564(b)(1) of the Act, 21 U.S.C.section 360bbb-3(b)(1), unless the authorization is terminated  or revoked sooner.       Influenza A by PCR NEGATIVE NEGATIVE Final   Influenza B by PCR NEGATIVE NEGATIVE Final    Comment: (NOTE) The Xpert Xpress SARS-CoV-2/FLU/RSV plus assay is intended as an aid in the diagnosis of influenza from Nasopharyngeal swab specimens and should not be used as a sole basis for treatment. Nasal washings and aspirates are unacceptable for Xpert Xpress SARS-CoV-2/FLU/RSV testing.  Fact Sheet for Patients: BloggerCourse.com  Fact Sheet for Healthcare Providers: SeriousBroker.it  This test is not yet approved or cleared by the [place] FDA and has been authorized for detection and/or diagnosis of SARS-CoV-2 by FDA under an Emergency Use Authorization (EUA). This EUA will remain in effect (meaning this test can be used) for the duration of the COVID-19 declaration under Section 564(b)(1) of the Act, 21 U.S.C. section 360bbb-3(b)(1), unless the authorization is terminated or revoked.  Performed at [facility], [address], [place], [place] [address]   Culture, blood (routine x 2)     Status: None   Collection Time: 04/05/21  2:11 PM   Specimen: BLOOD  Result Value Ref Range Status   Specimen Description BLOOD SITE NOT SPECIFIED  Final   Special Requests   Final     BOTTLES DRAWN AEROBIC AND ANAEROBIC Blood Culture results may not be optimal due to an inadequate volume of blood received in culture bottles   Culture   Final    NO GROWTH 5 DAYS Performed at [facility], [address] [place], [place] [address]    Report Status 04/10/2021 FINAL  Final  Culture, blood (routine x 2)     Status: None   Collection Time: 04/05/21  2:44 PM   Specimen: BLOOD  Result Value Ref Range Status   Specimen Description BLOOD RIGHT  ANTECUBITAL  Final   Special Requests   Final    BOTTLES DRAWN AEROBIC AND ANAEROBIC Blood Culture adequate volume   Culture   Final    NO GROWTH 5 DAYS Performed at [facility], [address] [place], [place] [address]    Report Status 04/10/2021 FINAL  Final         Radiology Studies: No results found.      Scheduled Meds:  dexamethasone (DECADRON) injection  10 mg Intravenous Q8H   enoxaparin (LOVENOX) injection  40 mg Subcutaneous Q24H   famotidine  20 mg Oral Daily   feeding supplement  237 mL Oral BID BM   ferrous sulfate  325 mg Oral Q breakfast   folic acid  1 mg Oral Daily   insulin aspart  0-5 Units Subcutaneous QHS   insulin aspart  0-9 Units Subcutaneous TID WC   multivitamin with minerals  1 tablet Oral Daily   thiamine  100 mg Oral Daily   Continuous Infusions:  clindamycin (CLEOCIN) IV 600 mg (04/10/21 1420)     LOS: 4 days    Darlin Drop, MD [facility]   To contact the attending provider between 7A-7P or the covering provider during after hours 7P-7A, please log into the web site [email or web address] and access using universal [place] password for that web site. If you do not have the password, please call the hospital operator.  04/10/2021, 3:21 PM

## 2021-04-11 ENCOUNTER — Other Ambulatory Visit (HOSPITAL_COMMUNITY): Payer: Self-pay

## 2021-04-11 LAB — MAGNESIUM: Magnesium: 2.4 mg/dL (ref 1.7–2.4)

## 2021-04-11 LAB — GLUCOSE, CAPILLARY
Glucose-Capillary: 135 mg/dL — ABNORMAL HIGH (ref 70–99)
Glucose-Capillary: 218 mg/dL — ABNORMAL HIGH (ref 70–99)

## 2021-04-11 LAB — CBC
HCT: 33.4 % — ABNORMAL LOW (ref 39.0–52.0)
Hemoglobin: 10.8 g/dL — ABNORMAL LOW (ref 13.0–17.0)
MCH: 22.7 pg — ABNORMAL LOW (ref 26.0–34.0)
MCHC: 32.3 g/dL (ref 30.0–36.0)
MCV: 70.2 fL — ABNORMAL LOW (ref 80.0–100.0)
Platelets: 876 10*3/uL — ABNORMAL HIGH (ref 150–400)
RBC: 4.76 MIL/uL (ref 4.22–5.81)
RDW: 13.2 % (ref 11.5–15.5)
WBC: 20.5 10*3/uL — ABNORMAL HIGH (ref 4.0–10.5)
nRBC: 0.2 % (ref 0.0–0.2)

## 2021-04-11 LAB — BASIC METABOLIC PANEL
Anion gap: 7 (ref 5–15)
BUN: 16 mg/dL (ref 6–20)
CO2: 26 mmol/L (ref 22–32)
Calcium: 9.2 mg/dL (ref 8.9–10.3)
Chloride: 96 mmol/L — ABNORMAL LOW (ref 98–111)
Creatinine, Ser: 0.83 mg/dL (ref 0.61–1.24)
GFR, Estimated: >60 mL/min (ref >60)
Glucose, Bld: 140 mg/dL — ABNORMAL HIGH (ref 70–99)
Potassium: 4.5 mmol/L (ref 3.5–5.1)
Sodium: 129 mmol/L — ABNORMAL LOW (ref 135–145)

## 2021-04-11 LAB — PHOSPHORUS: Phosphorus: 3.9 mg/dL (ref 2.5–4.6)

## 2021-04-11 MED ORDER — OXYCODONE HCL 5 MG PO TABS: 5.0000 mg | ORAL_TABLET | Freq: Four times a day (QID) | ORAL | 0 refills | Status: DC | PRN

## 2021-04-11 MED ORDER — SENNOSIDES-DOCUSATE SODIUM 8.6-50 MG PO TABS
1.0000 | ORAL_TABLET | Freq: Two times a day (BID) | ORAL | 0 refills | Status: AC
  Filled 2021-04-11: qty 14, 7d supply, fill #0

## 2021-04-11 MED ORDER — FOLIC ACID 1 MG PO TABS: 1.0000 mg | ORAL_TABLET | Freq: Every day | ORAL | 0 refills | Status: DC

## 2021-04-11 MED ORDER — THIAMINE HCL 100 MG PO TABS
100.0000 mg | ORAL_TABLET | Freq: Every day | ORAL | 0 refills | Status: AC
  Filled 2021-04-11: qty 30, 30d supply, fill #0

## 2021-04-11 MED ORDER — FOLIC ACID 1 MG PO TABS
1.0000 mg | ORAL_TABLET | Freq: Every day | ORAL | 0 refills | Status: AC
  Filled 2021-04-11: qty 30, 30d supply, fill #0

## 2021-04-11 MED ORDER — CLINDAMYCIN HCL 300 MG PO CAPS: 300.0000 mg | ORAL_CAPSULE | Freq: Four times a day (QID) | ORAL | 0 refills | Status: DC

## 2021-04-11 MED ORDER — OXYCODONE HCL 5 MG PO TABS
5.0000 mg | ORAL_TABLET | Freq: Four times a day (QID) | ORAL | 0 refills | Status: AC | PRN
Start: 2021-04-11 — End: ?
  Filled 2021-04-11: qty 14, 4d supply, fill #0

## 2021-04-11 MED ORDER — CLINDAMYCIN HCL 300 MG PO CAPS
300.0000 mg | ORAL_CAPSULE | Freq: Four times a day (QID) | ORAL | 0 refills | Status: AC
  Filled 2021-04-11: qty 40, 10d supply, fill #0

## 2021-04-11 MED ORDER — ADULT MULTIVITAMIN W/MINERALS CH: 1.0000 | ORAL_TABLET | Freq: Every day | ORAL | Status: DC

## 2021-04-11 MED ORDER — SENNOSIDES-DOCUSATE SODIUM 8.6-50 MG PO TABS
1.0000 | ORAL_TABLET | Freq: Two times a day (BID) | ORAL | 0 refills | Status: DC
Start: 2021-04-11 — End: 2021-04-11

## 2021-04-11 MED ORDER — THIAMINE HCL 100 MG PO TABS
100.0000 mg | ORAL_TABLET | Freq: Every day | ORAL | 0 refills | Status: DC
Start: 2021-04-11 — End: 2021-04-11

## 2021-04-11 MED ORDER — CERTAVITE/ANTIOXIDANTS PO TABS
1.0000 | ORAL_TABLET | Freq: Every day | ORAL | 0 refills | Status: AC
  Filled 2021-04-11: qty 30, 30d supply, fill #0

## 2021-04-11 NOTE — Progress Notes (Signed)
PIVs removed. Discharge medications and necessary follow up appointments discussed, patient verbalizes understanding. AVS provided with necessary information highlighted.

## 2021-04-11 NOTE — Discharge Summary (Signed)
Physician Discharge Summary  Harim Bi WEX:[id number] DOB: [birth year] DOA: 04/05/2021  PCP: Default, Provider, MD  Admit date: 04/05/2021 Discharge date: 04/11/2021  Admitted From: Home Disposition: Home  Recommendations for Outpatient Follow-up:  Follow up with PCP in 1 week with repeat CBC/BMP Outpatient follow-up with ENT Follow up in ED if symptoms worsen or new appear   Home Health: No Equipment/Devices: None  Discharge Condition: Stable CODE STATUS: Full Diet recommendation: Regular Brief/Interim Summary: 40 year old male with history of alcohol abuse presented with right lateral neck mass and difficulty swallowing. Work up revealed large right lateral neck abscess measuring 7.4 x 5.5 x 5.6 cm by CT scan.  Started on empiric IV antibiotics IV vancomycin and Rocephin, ENT was consulted and followed.  Due to poor response IV vancomycin and Rocephin, antibiotics were switched to IV clindamycin and IV Decadron was added on 04/07/2021.  Blood cultures have been negative to date.  His symptomatology improved with IV clindamycin and IV Decadron.  Drainage of the right neck abscess on 04/10/2021 by ENT.  Approximately 10 cc of purulent fluid was evacuated.  Cultures have remained negative so far.  Patient's condition is improved with improving swelling.  Currently hemoglobin is stable.  ENT has cleared the patient for discharge on oral clindamycin with outpatient follow-up with ENT.  Discharge Diagnoses:   Right lateral neck abscess possibly secondary to lymphadenitis: Present on admission -CT: 7.4 cm right lateral wall neck mass with extensive surrounding inflammation most consistent with an abscess.  -Started on empiric IV antibiotics IV vancomycin and Rocephin, ENT was consulted and followed.  Due to poor response IV vancomycin and Rocephin, antibiotics were switched to IV clindamycin and IV Decadron was added on 04/07/2021.  Blood cultures have been negative to date.  His  symptomatology improved with IV clindamycin and IV Decadron.  Drainage of the right neck abscess on 04/10/2021 by ENT.  Approximately 10 cc of purulent fluid was evacuated.  Cultures have remained negative so far.  Patient's condition is improved with improving swelling.  Currently hemoglobin is stable.  ENT has cleared the patient for discharge on oral clindamycin for total of 2 weeks with outpatient follow-up with ENT. -Will not continue Decadron on discharge  Dysphagia -Possibly from neck abscess.  Resolved.  Hyponatremia -Encourage oral intake.  Outpatient follow-up  Leukocytosis -Improving.  Outpatient follow-up  Chronic microcytic anemia -Hemoglobin stable.  Outpatient follow-up  Thrombocytosis: Possibly reactive -Outpatient follow-up  Elevated AST -Possibly from alcohol abuse.  Outpatient follow-up  Alcohol abuse -Treated with CIWA protocol.  Continue multivitamin, folic acid and thiamine.  Outpatient follow-up  Discharge Instructions  Discharge Instructions     Diet general   Complete by: As directed    Increase activity slowly   Complete by: As directed       Allergies as of 04/11/2021   No Known Allergies      Medication List     TAKE these medications    clindamycin 300 MG capsule Commonly known as: CLEOCIN Take 1 capsule (300 mg total) by mouth 4 (four) times daily for 10 days.   folic acid 1 MG tablet Commonly known as: FOLVITE Take 1 tablet (1 mg total) by mouth daily.   multivitamin with minerals Tabs tablet Take 1 tablet by mouth daily.   oxyCODONE 5 MG immediate release tablet Commonly known as: Oxy IR/ROXICODONE Take 1 tablet (5 mg total) by mouth every 6 (six) hours as needed for severe pain or breakthrough pain.   senna-docusate 8.6-50 MG tablet  Commonly known as: Senokot-S Take 1 tablet by mouth 2 (two) times daily.   thiamine 100 MG tablet Take 1 tablet (100 mg total) by mouth daily.        Follow-up Information     PCP.  Schedule an appointment as soon as possible for a visit in 1 week(s).          Newman Pies, MD. Schedule an appointment as soon as possible for a visit in 1 week(s).   Specialty: Otolaryngology Contact information: [address] [place] [address] [place] [address] [phone]                No Known Allergies  Consultations: ENT   Procedures/Studies: CT Soft Tissue Neck W Contrast  Result Date: 04/05/2021 CLINICAL DATA:  Right neck mass for 1 week.  Fever and leukocytosis. EXAM: CT NECK WITH CONTRAST TECHNIQUE: Multidetector CT imaging of the neck was performed using the standard protocol following the bolus administration of intravenous contrast. CONTRAST:  75mL OMNIPAQUE IOHEXOL 300 MG/ML  SOLN COMPARISON:  None. FINDINGS: Pharynx and larynx: No mass. Calcification along the anterior aspect of the right palatine tonsil. No retropharyngeal fluid collection. Widely patent airway. Salivary glands: Mild mass effect on the right parotid and right submandibular glands by the large right neck mass/collection described below which is not felt to be of primary salivary origin. Unremarkable appearance of the left submandibular and left parotid glands. Thyroid: Subcentimeter calcification in the left thyroid lobe for which no imaging follow-up is recommended. Lymph nodes: Subcentimeter short axis lymph nodes in the neck bilaterally. Vascular: Nonvisualization of the right internal jugular vein in the mid and upper neck, either completely effaced or occluded. Limited intracranial: Unremarkable. Visualized orbits: Unremarkable. Mastoids and visualized paranasal sinuses: Minimal mucosal thickening in the maxillary sinuses. Clear mastoid air cells. Skeleton: Scattered dental caries and periapical lucencies. Moderate disc degeneration at C5-6. Upper chest: Clear lung apices. Other: A predominantly low-density mass/collection in the lateral right upper neck with thick, irregular peripheral enhancement  measures 5.5 x 5.6 x 7.4 cm. The mass is inseparable from the right sternocleidomastoid muscle, and there is deep extension towards the parapharyngeal space. There is extensive surrounding inflammatory stranding which extends into the lower neck. IMPRESSION: 7.4 cm right upper neck mass with extensive surrounding inflammation most consistent with an abscess with this clinical history. Necrotic tumor is an alternative consideration. Electronically Signed   By: Sebastian Ache M.D.   On: 04/05/2021 16:26      Subjective: Patient seen and examined at bedside.  Feels that his Is improving and his swallowing is getting better.  No overnight fever or vomiting reported.  Feels okay to go home today.  Discharge Exam: Vitals:   04/11/21 0630 04/11/21 0731  BP: 111/74 111/72  Pulse: (!) 53 (!) 54  Resp: 20 17  Temp: 98 F (36.7 C) 98.1 F (36.7 C)  SpO2: 100% 100%    General: Pt is alert, awake, not in acute distress.  Currently on room air. Neck: Right lateral neck swelling present Cardiovascular: Intermittently bradycardic; S1/S2 + Respiratory: bilateral decreased breath sounds at bases Abdominal: Soft, NT, ND, bowel sounds + Extremities: no edema, no cyanosis    The results of significant diagnostics from this hospitalization (including imaging, microbiology, ancillary and laboratory) are listed below for reference.     Microbiology: Recent Results (from the past 240 hour(s))  Resp Panel by RT-PCR (Flu A&B, Covid) Nasopharyngeal Swab     Status: None   Collection Time:  04/05/21  1:55 PM   Specimen: Nasopharyngeal Swab; Nasopharyngeal(NP) swabs in vial transport medium  Result Value Ref Range Status   SARS Coronavirus 2 by RT PCR NEGATIVE NEGATIVE Final    Comment: (NOTE) SARS-CoV-2 target nucleic acids are NOT DETECTED.  The SARS-CoV-2 RNA is generally detectable in upper respiratory specimens during the acute phase of infection. The lowest concentration of SARS-CoV-2 viral copies  this assay can detect is 138 copies/mL. A negative result does not preclude SARS-Cov-2 infection and should not be used as the sole basis for treatment or other patient management decisions. A negative result may occur with  improper specimen collection/handling, submission of specimen other than nasopharyngeal swab, presence of viral mutation(s) within the areas targeted by this assay, and inadequate number of viral copies(<138 copies/mL). A negative result must be combined with clinical observations, patient history, and epidemiological information. The expected result is Negative.  Fact Sheet for Patients:  BloggerCourse.com  Fact Sheet for Healthcare Providers:  SeriousBroker.it  This test is no t yet approved or cleared by the [place] FDA and  has been authorized for detection and/or diagnosis of SARS-CoV-2 by FDA under an Emergency Use Authorization (EUA). This EUA will remain  in effect (meaning this test can be used) for the duration of the COVID-19 declaration under Section 564(b)(1) of the Act, 21 U.S.C.section 360bbb-3(b)(1), unless the authorization is terminated  or revoked sooner.       Influenza A by PCR NEGATIVE NEGATIVE Final   Influenza B by PCR NEGATIVE NEGATIVE Final    Comment: (NOTE) The Xpert Xpress SARS-CoV-2/FLU/RSV plus assay is intended as an aid in the diagnosis of influenza from Nasopharyngeal swab specimens and should not be used as a sole basis for treatment. Nasal washings and aspirates are unacceptable for Xpert Xpress SARS-CoV-2/FLU/RSV testing.  Fact Sheet for Patients: BloggerCourse.com  Fact Sheet for Healthcare Providers: SeriousBroker.it  This test is not yet approved or cleared by the [place] FDA and has been authorized for detection and/or diagnosis of SARS-CoV-2 by FDA under an Emergency Use Authorization (EUA). This EUA  will remain in effect (meaning this test can be used) for the duration of the COVID-19 declaration under Section 564(b)(1) of the Act, 21 U.S.C. section 360bbb-3(b)(1), unless the authorization is terminated or revoked.  Performed at [facility], [address] [place], [place] [address]   Culture, blood (routine x 2)     Status: None   Collection Time: 04/05/21  2:11 PM   Specimen: BLOOD  Result Value Ref Range Status   Specimen Description BLOOD SITE NOT SPECIFIED  Final   Special Requests   Final    BOTTLES DRAWN AEROBIC AND ANAEROBIC Blood Culture results may not be optimal due to an inadequate volume of blood received in culture bottles   Culture   Final    NO GROWTH 5 DAYS Performed at [facility], [address] [place], [place] [address]    Report Status 04/10/2021 FINAL  Final  Culture, blood (routine x 2)     Status: None   Collection Time: 04/05/21  2:44 PM   Specimen: BLOOD  Result Value Ref Range Status   Specimen Description BLOOD RIGHT ANTECUBITAL  Final   Special Requests   Final    BOTTLES DRAWN AEROBIC AND ANAEROBIC Blood Culture adequate volume   Culture   Final    NO GROWTH 5 DAYS Performed at [facility], [address], [place], [place] [address]  Report Status 04/10/2021 FINAL  Final     Labs: BNP (last 3 results) No results for input(s): BNP in the last 8760 hours. Basic Metabolic Panel: Recent Labs  Lab 04/06/21 0400 04/07/21 0743 04/08/21 0350 04/09/21 0411 04/11/21 0447  NA 129* 129* 132* 133* 129*  K 3.8 3.9 4.0 4.4 4.5  CL 96* 91* 97* 100 96*  CO2 24 23 27 25 26   GLUCOSE 110* 137* 165* 149* 140*  BUN 10 5* 9 15 16   CREATININE 1.05 0.86 0.90 0.82 0.83  CALCIUM 8.6* 8.3* 8.9 9.1 9.2  MG  --   --   --   --  2.4  PHOS  --   --   --   --  3.9   Liver Function Tests: Recent Labs  Lab 04/05/21 1245  AST 43*  ALT 40  ALKPHOS 63  BILITOT 0.8  PROT 7.8  ALBUMIN 3.1*   No results for input(s): LIPASE,  AMYLASE in the last 168 hours. No results for input(s): AMMONIA in the last 168 hours. CBC: Recent Labs  Lab 04/05/21 1245 04/06/21 0400 04/07/21 0743 04/08/21 0350 04/09/21 0411 04/11/21 0447  WBC 20.1* 19.5* 22.5* 21.9* 26.1* 20.5*  NEUTROABS 16.9*  --   --   --   --   --   HGB 11.4* 10.2* 10.9* 10.5* 9.3* 10.8*  HCT 34.7* 31.0* 34.1* 32.3* 29.2* 33.4*  MCV 69.7* 70.0* 70.7* 69.6* 70.5* 70.2*  PLT 396 425* 545* 587* 709* 876*   Cardiac Enzymes: No results for input(s): CKTOTAL, CKMB, CKMBINDEX, TROPONINI in the last 168 hours. BNP: Invalid input(s): POCBNP CBG: Recent Labs  Lab 04/10/21 0644 04/10/21 1120 04/10/21 1631 04/10/21 2111 04/11/21 0644  GLUCAP 155* 149* 189* 140* 135*   D-Dimer No results for input(s): DDIMER in the last 72 hours. Hgb A1c Recent Labs    04/09/21 0411  HGBA1C 5.2   Lipid Profile No results for input(s): CHOL, HDL, LDLCALC, TRIG, CHOLHDL, LDLDIRECT in the last 72 hours. Thyroid function studies No results for input(s): TSH, T4TOTAL, T3FREE, THYROIDAB in the last 72 hours.  Invalid input(s): FREET3 Anemia work up No results for input(s): VITAMINB12, FOLATE, FERRITIN, TIBC, IRON, RETICCTPCT in the last 72 hours. Urinalysis No results found for: COLORURINE, APPEARANCEUR, LABSPEC, PHURINE, GLUCOSEU, HGBUR, BILIRUBINUR, KETONESUR, PROTEINUR, UROBILINOGEN, NITRITE, LEUKOCYTESUR Sepsis Labs Invalid input(s): PROCALCITONIN,  WBC,  LACTICIDVEN Microbiology Recent Results (from the past 240 hour(s))  Resp Panel by RT-PCR (Flu A&B, Covid) Nasopharyngeal Swab     Status: None   Collection Time: 04/05/21  1:55 PM   Specimen: Nasopharyngeal Swab; Nasopharyngeal(NP) swabs in vial transport medium  Result Value Ref Range Status   SARS Coronavirus 2 by RT PCR NEGATIVE NEGATIVE Final    Comment: (NOTE) SARS-CoV-2 target nucleic acids are NOT DETECTED.  The SARS-CoV-2 RNA is generally detectable in upper respiratory specimens during the acute  phase of infection. The lowest concentration of SARS-CoV-2 viral copies this assay can detect is 138 copies/mL. A negative result does not preclude SARS-Cov-2 infection and should not be used as the sole basis for treatment or other patient management decisions. A negative result may occur with  improper specimen collection/handling, submission of specimen other than nasopharyngeal swab, presence of viral mutation(s) within the areas targeted by this assay, and inadequate number of viral copies(<138 copies/mL). A negative result must be combined with clinical observations, patient history, and epidemiological information. The expected result is Negative.  Fact Sheet for Patients:  04/11/21  Fact Sheet for  Healthcare Providers:  SeriousBroker.it  This test is no t yet approved or cleared by the [place] and  has been authorized for detection and/or diagnosis of SARS-CoV-2 by FDA under an Emergency Use Authorization (EUA). This EUA will remain  in effect (meaning this test can be used) for the duration of the COVID-19 declaration under Section 564(b)(1) of the Act, 21 U.S.C.section 360bbb-3(b)(1), unless the authorization is terminated  or revoked sooner.       Influenza A by PCR NEGATIVE NEGATIVE Final   Influenza B by PCR NEGATIVE NEGATIVE Final    Comment: (NOTE) The Xpert Xpress SARS-CoV-2/FLU/RSV plus assay is intended as an aid in the diagnosis of influenza from Nasopharyngeal swab specimens and should not be used as a sole basis for treatment. Nasal washings and aspirates are unacceptable for Xpert Xpress SARS-CoV-2/FLU/RSV testing.  Fact Sheet for Patients: BloggerCourse.com  Fact Sheet for Healthcare Providers: SeriousBroker.it  This test is not yet approved or cleared by the [place] FDA and has been authorized for detection and/or diagnosis of  SARS-CoV-2 by FDA under an Emergency Use Authorization (EUA). This EUA will remain in effect (meaning this test can be used) for the duration of the COVID-19 declaration under Section 564(b)(1) of the Act, 21 U.S.C. section 360bbb-3(b)(1), unless the authorization is terminated or revoked.  Performed at [facility], [address] [place], [place] [address]   Culture, blood (routine x 2)     Status: None   Collection Time: 04/05/21  2:11 PM   Specimen: BLOOD  Result Value Ref Range Status   Specimen Description BLOOD SITE NOT SPECIFIED  Final   Special Requests   Final    BOTTLES DRAWN AEROBIC AND ANAEROBIC Blood Culture results may not be optimal due to an inadequate volume of blood received in culture bottles   Culture   Final    NO GROWTH 5 DAYS Performed at [facility], [address] [place], [place] [address]    Report Status 04/10/2021 FINAL  Final  Culture, blood (routine x 2)     Status: None   Collection Time: 04/05/21  2:44 PM   Specimen: BLOOD  Result Value Ref Range Status   Specimen Description BLOOD RIGHT ANTECUBITAL  Final   Special Requests   Final    BOTTLES DRAWN AEROBIC AND ANAEROBIC Blood Culture adequate volume   Culture   Final    NO GROWTH 5 DAYS Performed at [facility], [address], [place], [place] [address]    Report Status 04/10/2021 FINAL  Final     Time coordinating discharge: 35 minutes  SIGNED:   Glade Lloyd, MD  [facility] 04/11/2021, 10:10 AM

## 2021-04-11 NOTE — TOC Transition Note (Signed)
Transition of Care [facility]) - CM/SW Discharge Note   Patient Details  Name: Irbin Fines MRN: [id number] Date of Birth: 01/15/1981  Transition of Care [facility]) CM/SW Contact:  Tom-Johnson, Hershal Coria, RN Phone Number: 04/11/2021, 11:51 AM   Clinical Narrative:    Patient is scheduled for discharge today. CM consulted for PCP, Medical Insurance and Substance abuse counseling. CM spoke with patient at bedside. Patient states he recently moved from [place] with his wife and nine children. Presented to the ED with rt neck swelling and difficulty swallowing. Found to have abscess, possibly 2/2 Lymphadenitis. Treated with  IV antibiotics and steroids. Abscess aspirated yesterday.Patient states he felt better especially with his swallowing. Patient is currently employed by [employer] and works part-time. 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 Information sent to Financial counselor, Christia Reading and she states  patient's accounts has been sent over to [facility] for a Medicaid screening. Patient uses [facility] but prefers to use TOC. MATCH done and patient should get his meds from [facility] pharmacy. Hospital followup appointment scheduled with [facility] on January 16th at 2:30 pm with Dr. Laural Benes. Lift transportation scheduled at discharge. No further TOC needs noted.   Final next level of care: Home/Self Care Barriers to Discharge: Barriers Resolved   Patient Goals and CMS Choice Patient states their goals for this hospitalization and ongoing recovery are:: To go home CMS Medicare.gov Compare Post Acute Care list provided to:: Patient Choice offered to / list presented to : NA  Discharge Placement                       Discharge Plan and Services                DME Arranged: N/A DME Agency: NA       HH Arranged: NA HH Agency: NA        Social Determinants of Health (SDOH) Interventions     Readmission Risk Interventions No flowsheet data found.

## 2021-04-12 ENCOUNTER — Inpatient Hospital Stay (HOSPITAL_COMMUNITY)
Admission: EM | Admit: 2021-04-12 | Discharge: 2021-04-16 | DRG: 603 | Disposition: A | Discharge status: Home / Self Care | Payer: Medicaid Other | Attending: Family Medicine | Admitting: Family Medicine

## 2021-04-12 ENCOUNTER — Emergency Department (HOSPITAL_COMMUNITY): Payer: Medicaid Other

## 2021-04-12 ENCOUNTER — Encounter (HOSPITAL_COMMUNITY): Payer: Self-pay | Admitting: Emergency Medicine

## 2021-04-12 DIAGNOSIS — R609 Edema, unspecified: Secondary | ICD-10-CM | POA: Diagnosis not present

## 2021-04-12 DIAGNOSIS — R739 Hyperglycemia, unspecified: Secondary | ICD-10-CM | POA: Diagnosis present

## 2021-04-12 DIAGNOSIS — D75839 Thrombocytosis, unspecified: Secondary | ICD-10-CM

## 2021-04-12 DIAGNOSIS — L0211 Cutaneous abscess of neck: Secondary | ICD-10-CM | POA: Diagnosis not present

## 2021-04-12 DIAGNOSIS — L0291 Cutaneous abscess, unspecified: Secondary | ICD-10-CM

## 2021-04-12 DIAGNOSIS — R7401 Elevation of levels of liver transaminase levels: Secondary | ICD-10-CM

## 2021-04-12 DIAGNOSIS — E871 Hypo-osmolality and hyponatremia: Secondary | ICD-10-CM | POA: Diagnosis present

## 2021-04-12 DIAGNOSIS — D509 Iron deficiency anemia, unspecified: Secondary | ICD-10-CM | POA: Diagnosis present

## 2021-04-12 DIAGNOSIS — Z79899 Other long term (current) drug therapy: Secondary | ICD-10-CM

## 2021-04-12 DIAGNOSIS — Z20822 Contact with and (suspected) exposure to covid-19: Secondary | ICD-10-CM | POA: Diagnosis present

## 2021-04-12 DIAGNOSIS — R0600 Dyspnea, unspecified: Secondary | ICD-10-CM | POA: Diagnosis present

## 2021-04-12 DIAGNOSIS — I889 Nonspecific lymphadenitis, unspecified: Secondary | ICD-10-CM | POA: Diagnosis present

## 2021-04-12 LAB — COMPREHENSIVE METABOLIC PANEL
ALT: 50 U/L — ABNORMAL HIGH (ref 0–44)
AST: 25 U/L (ref 15–41)
Albumin: 2.9 g/dL — ABNORMAL LOW (ref 3.5–5.0)
Alkaline Phosphatase: 51 U/L (ref 38–126)
Anion gap: 9 (ref 5–15)
BUN: 16 mg/dL (ref 6–20)
CO2: 26 mmol/L (ref 22–32)
Calcium: 8.8 mg/dL — ABNORMAL LOW (ref 8.9–10.3)
Chloride: 95 mmol/L — ABNORMAL LOW (ref 98–111)
Creatinine, Ser: 0.82 mg/dL (ref 0.61–1.24)
GFR, Estimated: >60 mL/min (ref >60)
Glucose, Bld: 107 mg/dL — ABNORMAL HIGH (ref 70–99)
Potassium: 3.7 mmol/L (ref 3.5–5.1)
Sodium: 130 mmol/L — ABNORMAL LOW (ref 135–145)
Total Bilirubin: 0.5 mg/dL (ref 0.3–1.2)
Total Protein: 6.6 g/dL (ref 6.5–8.1)

## 2021-04-12 LAB — CBC WITH DIFFERENTIAL/PLATELET
Abs Immature Granulocytes: 2.38 10*3/uL — ABNORMAL HIGH (ref 0.00–0.07)
Basophils Absolute: 0.2 10*3/uL — ABNORMAL HIGH (ref 0.0–0.1)
Basophils Relative: 1 %
Eosinophils Absolute: 0 10*3/uL (ref 0.0–0.5)
Eosinophils Relative: 0 %
HCT: 34.6 % — ABNORMAL LOW (ref 39.0–52.0)
Hemoglobin: 10.7 g/dL — ABNORMAL LOW (ref 13.0–17.0)
Immature Granulocytes: 8 %
Lymphocytes Relative: 7 %
Lymphs Abs: 2 10*3/uL (ref 0.7–4.0)
MCH: 22.3 pg — ABNORMAL LOW (ref 26.0–34.0)
MCHC: 30.9 g/dL (ref 30.0–36.0)
MCV: 72.2 fL — ABNORMAL LOW (ref 80.0–100.0)
Monocytes Absolute: 2 10*3/uL — ABNORMAL HIGH (ref 0.1–1.0)
Monocytes Relative: 7 %
Neutro Abs: 21.9 10*3/uL — ABNORMAL HIGH (ref 1.7–7.7)
Neutrophils Relative %: 77 %
Platelets: 937 10*3/uL (ref 150–400)
RBC: 4.79 MIL/uL (ref 4.22–5.81)
RDW: 13.6 % (ref 11.5–15.5)
WBC: 28.5 10*3/uL — ABNORMAL HIGH (ref 4.0–10.5)
nRBC: 0.8 % — ABNORMAL HIGH (ref 0.0–0.2)

## 2021-04-12 LAB — LACTIC ACID, PLASMA
Lactic Acid, Venous: 1.7 mmol/L (ref 0.5–1.9)
Lactic Acid, Venous: 1.6 mmol/L (ref 0.5–1.9)

## 2021-04-12 MED ORDER — CLINDAMYCIN PHOSPHATE 600 MG/50ML IV SOLN
600.0000 mg | Freq: Three times a day (TID) | INTRAVENOUS | Status: DC
  Administered 2021-04-13 – 2021-04-16 (×9): 600 mg via INTRAVENOUS
  Filled 2021-04-12 (×12): qty 50

## 2021-04-12 MED ORDER — MORPHINE SULFATE (PF) 2 MG/ML IV SOLN
1.0000 mg | INTRAVENOUS | Status: DC | PRN
  Administered 2021-04-12 – 2021-04-16 (×13): 1 mg via INTRAVENOUS
  Filled 2021-04-12 (×13): qty 1

## 2021-04-12 MED ORDER — DEXAMETHASONE SODIUM PHOSPHATE 10 MG/ML IJ SOLN
20.0000 mg | Freq: Once | INTRAMUSCULAR | Status: AC
  Administered 2021-04-12: 20 mg via INTRAVENOUS
  Filled 2021-04-12: qty 2

## 2021-04-12 MED ORDER — CLINDAMYCIN PHOSPHATE 300 MG/50ML IV SOLN
300.0000 mg | Freq: Once | INTRAVENOUS | Status: AC
  Administered 2021-04-12: 300 mg via INTRAVENOUS
  Filled 2021-04-12: qty 50

## 2021-04-12 MED ORDER — SODIUM CHLORIDE 0.9% FLUSH: 3.0000 mL | INTRAVENOUS | Status: DC | PRN

## 2021-04-12 MED ORDER — CLINDAMYCIN PHOSPHATE 600 MG/50ML IV SOLN: 600.0000 mg | Freq: Three times a day (TID) | INTRAVENOUS | Status: DC

## 2021-04-12 MED ORDER — SODIUM CHLORIDE 0.9% FLUSH
3.0000 mL | Freq: Two times a day (BID) | INTRAVENOUS | Status: DC
  Administered 2021-04-14 – 2021-04-15 (×2): 3 mL via INTRAVENOUS

## 2021-04-12 MED ORDER — IOHEXOL 300 MG/ML  SOLN
75.0000 mL | Freq: Once | INTRAMUSCULAR | Status: AC | PRN
  Administered 2021-04-12: 75 mL via INTRAVENOUS

## 2021-04-12 MED ORDER — CLINDAMYCIN PHOSPHATE 600 MG/50ML IV SOLN
600.0000 mg | Freq: Once | INTRAVENOUS | Status: AC
  Administered 2021-04-12: 600 mg via INTRAVENOUS
  Filled 2021-04-12: qty 50

## 2021-04-12 MED ORDER — SODIUM CHLORIDE 0.9 % IV SOLN: 250.0000 mL | INTRAVENOUS | Status: DC | PRN

## 2021-04-12 NOTE — Progress Notes (Addendum)
Pt well known to me from his last admission.  Apparently he did not get his antibiotic after discharge.  His CT is reviewed.  His airway is patent.   Plan to perform I&D of his neck abscess in OR tomorrow morning. His case is scheduled at 10:30 am.  Please keep him NPO after midnight.  Recommend 900mg  IV clindamycin and 20mg  IV decadron, then 600mg  IV clindamycin q8 hours.

## 2021-04-12 NOTE — ED Provider Notes (Addendum)
[facility] EMERGENCY DEPARTMENT Provider Note   CSN: [id number] Arrival date & time: 04/12/21  1214     History Chief Complaint  Patient presents with   Abscess    Ernest Jenkins is a 40 y.o. male.  40 year old male presents with worsening right-sided neck abscess.  Was just discharged from hospital yesterday for similar issues.  Had a needle aspiration at that time.  States that he feels as if there is more pressure on the right side of his neck now causing him to have trouble breathing.  No fevers but does note muscle aches.  Has not had a chance to start his antibiotics yet.  Some increased dyspnea      History reviewed. No pertinent past medical history.  Patient Active Problem List   Diagnosis Date Noted   Neck abscess 04/05/2021   Sepsis (HCC) 04/05/2021   Hyponatremia 04/05/2021   Microcytic anemia 04/05/2021    No past surgical history on file.     No family history on file.     Home Medications Prior to Admission medications   Medication Sig Start Date End Date Taking? Authorizing Provider  clindamycin (CLEOCIN) 300 MG capsule Take 1 capsule (300 mg total) by mouth 4 (four) times daily for 10 days. 04/11/21 04/21/21  Glade Lloyd, MD  folic acid (FOLVITE) 1 MG tablet Take 1 tablet (1 mg total) by mouth daily. 04/11/21   Glade Lloyd, MD  Multiple Vitamins-Minerals (CERTAVITE/ANTIOXIDANTS) TABS Take 1 tablet by mouth daily. 04/11/21   Glade Lloyd, MD  oxyCODONE (OXY IR/ROXICODONE) 5 MG immediate release tablet Take 1 tablet (5 mg total) by mouth every 6 (six) hours as needed for severe pain or breakthrough pain. 04/11/21   Glade Lloyd, MD  senna-docusate (SENOKOT-S) 8.6-50 MG tablet Take 1 tablet by mouth 2 (two) times daily. 04/11/21   Glade Lloyd, MD  thiamine 100 MG tablet Take 1 tablet (100 mg total) by mouth daily. 04/11/21   Glade Lloyd, MD    Allergies    Patient has no known allergies.  Review of Systems   Review of  Systems  All other systems reviewed and are negative.  Physical Exam Updated Vital Signs BP 121/74 (BP Location: Right Arm)   Pulse 86   Temp 99.3 F (37.4 C) (Oral)   Resp 16   SpO2 100%   Physical Exam Vitals and nursing note reviewed.  Constitutional:      General: He is not in acute distress.    Appearance: Normal appearance. He is well-developed. He is not toxic-appearing.  HENT:     Head: Normocephalic and atraumatic.  Eyes:     General: Lids are normal.     Conjunctiva/sclera: Conjunctivae normal.     Pupils: Pupils are equal, round, and reactive to light.  Neck:     Thyroid: No thyroid mass.     Trachea: No tracheal deviation.   Cardiovascular:     Rate and Rhythm: Normal rate and regular rhythm.     Heart sounds: Normal heart sounds. No murmur heard.   No gallop.  Pulmonary:     Effort: Pulmonary effort is normal. No respiratory distress.     Breath sounds: Normal breath sounds. No stridor. No decreased breath sounds, wheezing, rhonchi or rales.     Comments: No stridor appreciated. Abdominal:     General: There is no distension.     Palpations: Abdomen is soft.     Tenderness: There is no abdominal tenderness. There is no rebound.  Musculoskeletal:        General: No tenderness. Normal range of motion.     Cervical back: Normal range of motion and neck supple.  Skin:    General: Skin is warm and dry.     Findings: No abrasion or rash.  Neurological:     Mental Status: He is alert and oriented to person, place, and time. Mental status is at baseline.     GCS: GCS eye subscore is 4. GCS verbal subscore is 5. GCS motor subscore is 6.     Cranial Nerves: No cranial nerve deficit.     Sensory: No sensory deficit.     Motor: Motor function is intact.  Psychiatric:        Attention and Perception: Attention normal.        Speech: Speech normal.        Behavior: Behavior normal.    ED Results / Procedures / Treatments   Labs (all labs ordered are listed,  but only abnormal results are displayed) Labs Reviewed  CBC WITH DIFFERENTIAL/PLATELET - Abnormal; Notable for the following components:      Result Value   WBC 28.5 (*)    Hemoglobin 10.7 (*)    HCT 34.6 (*)    MCV 72.2 (*)    MCH 22.3 (*)    Platelets 937 (*)    nRBC 0.8 (*)    Neutro Abs 21.9 (*)    Monocytes Absolute 2.0 (*)    Basophils Absolute 0.2 (*)    Abs Immature Granulocytes 2.38 (*)    All other components within normal limits  COMPREHENSIVE METABOLIC PANEL - Abnormal; Notable for the following components:   Sodium 130 (*)    Chloride 95 (*)    Glucose, Bld 107 (*)    Calcium 8.8 (*)    Albumin 2.9 (*)    ALT 50 (*)    All other components within normal limits  LACTIC ACID, PLASMA  LACTIC ACID, PLASMA  PATHOLOGIST SMEAR REVIEW    EKG None  Radiology CT Soft Tissue Neck W Contrast  Result Date: 04/12/2021 CLINICAL DATA:  Neck abscess EXAM: CT NECK WITH CONTRAST TECHNIQUE: Multidetector CT imaging of the neck was performed using the standard protocol following the bolus administration of intravenous contrast. CONTRAST:  42mL OMNIPAQUE IOHEXOL 300 MG/ML  SOLN COMPARISON:  CT neck 04/05/2021 FINDINGS: Pharynx and larynx: The nasal cavity and nasopharynx are unremarkable. There is mucosal edema in the right oropharynx extending into the vallecula and hypopharynx/larynx. There is effacement of the right piriform sinus. This is slightly increased since the study from 04/05/2021. The vocal folds are unremarkable. Salivary glands: The right parotid gland again appears anteriorly displaced by the below described large collection in the right neck which is again felt unlikely to be parotid in origin. The right submandibular gland is also slightly anteriorly displaced. The left parotid and submandibular glands are normal. Thyroid: Normal. Lymph nodes: There is no pathologic lymphadenopathy in the neck. Vascular: The right internal jugular vein is again not identified. The  vasculature is otherwise unremarkable. Limited intracranial: The imaged portions of the intracranial compartment are unremarkable. Visualized orbits: Not evaluated. Mastoids and visualized paranasal sinuses: The imaged paranasal sinuses are clear. The imaged mastoid air cells are clear. Skeleton: There is no acute osseous abnormality or aggressive osseous lesion. Upper chest: The imaged lung apices are clear. Other: Again seen is a large peripherally enhancing fluid collection in the right neck centered at the level of the hyoid bone.  The collection measures up to approximately 7.9 cm AP by 6.4 cm TV by 8.1 cm cc. Size comparison to the prior study is difficult due to the irregularity of the collection, but overall the size appears slightly increased. The collection extends from posterior to the angle of the mandible superiorly into the sternocleidomastoid at the level of the thyroid inferiorly. Anteriorly, the collection abuts the submandibular gland. Posteriorly, the lesion extends to abut the paraspinal musculature. There is medial extent towards the right crus of the hyoid bone which appears slightly increased in extent from the prior study, and as above, edema in the adjacent pharyngeal mucosa is increased. There is significant inflammatory change in the surrounding soft tissues of the neck. IMPRESSION: Interval increase in size of the large peripherally enhancing collection in the right neck again consistent with a large abscess given recent drainage of purulent material. There is slightly increased medial extent at the level of the hyoid bone with increased edema in the oropharynx extending into the vallecula with new effacement of the right piriform sinus. Electronically Signed   By: Lesia Hausen M.D.   On: 04/12/2021 14:48    Procedures Procedures   Medications Ordered in ED Medications  iohexol (OMNIPAQUE) 300 MG/ML solution 75 mL (75 mLs Intravenous Contrast Given 04/12/21 1435)    ED Course  I  have reviewed the triage vital signs and the nursing notes.  Pertinent labs & imaging results that were available during my care of the patient were reviewed by me and considered in my medical decision making (see chart for details).    MDM Rules/Calculators/A&P                           Patient is white count is getting worse.  His CT scan of the neck shows increasing size of his abscess.  Will consult ENT emergently and patient will require admission.  We will restart IV antibiotics  6:25 PM Discussed with ENT on-call who will come and see the patient.  Recommends patient get Cleocin 900 mg IV along with 2 mg of Decadron and readmission to the hospital service. Final Clinical Impression(s) / ED Diagnoses Final diagnoses:  None    Rx / DC Orders ED Discharge Orders     None        Lorre Nick, MD 04/12/21 1755    Lorre Nick, MD 04/12/21 1825

## 2021-04-12 NOTE — ED Triage Notes (Signed)
[name] from home for evaluation of return of abscess on right side of neck for which patient was admitted to hospital approximately one week ago. Patient alert, oriented, and in no apparent distress at this time.

## 2021-04-12 NOTE — H&P (Addendum)
History and Physical    Ernest Jenkins XTG:[id number] DOB: [birth year] DOA: 04/12/2021  PCP: Default, Provider, MD Consultants:  none Patient coming from:  Home - lives with wife and 9 kids  Chief Complaint: neck mass and problems breathing   HPI: Ernest Jenkins is a 40 y.o. male with medical history significant of alcohol abuse who was recently discharged just yesterday for right lateral neck abscess possibly secondary to lymphadenitis. He presents back to ED today stating that he woke up this morning and felt like his neck was more swollen and he had some difficulty breathing. He has taken the medication he was discharged home with. Denies any fever/chills, denies any stridors. Denies any shortness of breath, but states it just feels harder to breath. No cough, chest pain, palpitations, abdominal pain, N/V/D, leg swelling. No ear pain or tooth pain or abscess.   Brief note on recent hospitalization from discharge summary by dr. Hanley Ben Admit: 04/05/21 and discharged 04/11/21  40 year old male with history of alcohol abuse presented with right lateral neck mass and difficulty swallowing. Work up revealed large right lateral neck abscess measuring 7.4 x 5.5 x 5.6 cm by CT scan.  Started on empiric IV antibiotics IV vancomycin and Rocephin, ENT was consulted and followed.  Due to poor response IV vancomycin and Rocephin, antibiotics were switched to IV clindamycin and IV Decadron was added on 04/07/2021.  Blood cultures have been negative to date.  His symptomatology improved with IV clindamycin and IV Decadron.  Drainage of the right neck abscess on 04/10/2021 by ENT.  Approximately 10 cc of purulent fluid was evacuated.  Cultures have remained negative so far.  Patient's condition is improved with improving swelling.  Currently hemoglobin is stable.  ENT has cleared the patient for discharge on oral clindamycin with outpatient follow-up with ENT.  ED Course: vitals: afebrile, bp: 115/61, HR: 55, RR: 18,  oxygen: 100% RA Pertinent labs: WBC: 28.5, hgb: 10.7, platelets: 937, sodium: 130, AST: 50, lactic acid: 1.7 CT soft tissue neck: Interval increase in size of the large peripherally enhancing collection in the right neck again consistent with a large abscess given recent drainage of purulent material. There is slightly increased medial extent at the level of the hyoid bone with increased edema in the oropharynx extending into the vallecula with new effacement of the right piriform sinus.  In ED: ENT was consulted and recommended to start cleocin 900mg  IV and give 20mg  IV decadron and they would come and see.  [facility] was asked to admit.   Review of Systems: As per HPI; otherwise review of systems reviewed and negative.   Ambulatory Status:  Ambulates without assistance   History reviewed. No pertinent past medical history.  No past surgical history on file.  Social History   Socioeconomic History   Marital status: Single    Spouse name: Not on file   Number of children: Not on file   Years of education: Not on file   Highest education level: Not on file  Occupational History   Not on file  Tobacco Use   Smoking status: Not on file   Smokeless tobacco: Not on file  Substance and Sexual Activity   Alcohol use: Not on file   Drug use: Not on file   Sexual activity: Not on file  Other Topics Concern   Not on file  Social History Narrative   Not on file   Social Determinants of Health   Financial Resource Strain: Not on file  Food  Insecurity: Not on file  Transportation Needs: Not on file  Physical Activity: Not on file  Stress: Not on file  Social Connections: Not on file  Intimate Partner Violence: Not on file    No Known Allergies  No family history on file.  Prior to Admission medications   Medication Sig Start Date End Date Taking? Authorizing Provider  clindamycin (CLEOCIN) 300 MG capsule Take 1 capsule (300 mg total) by mouth 4 (four) times daily for 10 days.  04/11/21 04/21/21  Glade Lloyd, MD  folic acid (FOLVITE) 1 MG tablet Take 1 tablet (1 mg total) by mouth daily. 04/11/21   Glade Lloyd, MD  Multiple Vitamins-Minerals (CERTAVITE/ANTIOXIDANTS) TABS Take 1 tablet by mouth daily. 04/11/21   Glade Lloyd, MD  oxyCODONE (OXY IR/ROXICODONE) 5 MG immediate release tablet Take 1 tablet (5 mg total) by mouth every 6 (six) hours as needed for severe pain or breakthrough pain. 04/11/21   Glade Lloyd, MD  senna-docusate (SENOKOT-S) 8.6-50 MG tablet Take 1 tablet by mouth 2 (two) times daily. 04/11/21   Glade Lloyd, MD  thiamine 100 MG tablet Take 1 tablet (100 mg total) by mouth daily. 04/11/21   Glade Lloyd, MD    Physical Exam: Vitals:   04/12/21 1232 04/12/21 1341 04/12/21 1523 04/12/21 1814  BP: 115/61  121/74 110/70  Pulse: (!) 55  86 83  Resp: 18  16 13   Temp:  98.9 F (37.2 C) 99.3 F (37.4 C)   TempSrc:  Oral Oral   SpO2: 100%  100% 100%     General:  Appears calm and comfortable and is in NAD Eyes:  PERRL, EOMI, normal lids, iris ENT:  grossly normal hearing, lips & tongue, mmm; poor dentition with some bad teeth, but no tooth abscess Neck:  no thyromegaly; no carotid bruits. Large right sided abscess of neck, increased in size from last admit that is erythematous, warm and indurated. TTP      Cardiovascular:  RRR, no m/r/g. No LE edema.  Respiratory:   CTA bilaterally with no wheezes/rales/rhonchi.  Normal respiratory effort. Abdomen:  soft, NT, ND, NABS Back:   normal alignment, no CVAT Skin:  no rash or induration seen on limited exam Musculoskeletal:  grossly normal tone BUE/BLE, good ROM, no bony abnormality Lower extremity:  No LE edema.  Limited foot exam with no ulcerations.  2+ distal pulses. Psychiatric:  grossly normal mood and affect, speech fluent and appropriate, AOx3 Neurologic:  CN 2-12 grossly intact, moves all extremities in coordinated fashion, sensation intact    Radiological Exams on  Admission: Independently reviewed - see discussion in A/P where applicable  CT Soft Tissue Neck W Contrast  Result Date: 04/12/2021 CLINICAL DATA:  Neck abscess EXAM: CT NECK WITH CONTRAST TECHNIQUE: Multidetector CT imaging of the neck was performed using the standard protocol following the bolus administration of intravenous contrast. CONTRAST:  95mL OMNIPAQUE IOHEXOL 300 MG/ML  SOLN COMPARISON:  CT neck 04/05/2021 FINDINGS: Pharynx and larynx: The nasal cavity and nasopharynx are unremarkable. There is mucosal edema in the right oropharynx extending into the vallecula and hypopharynx/larynx. There is effacement of the right piriform sinus. This is slightly increased since the study from 04/05/2021. The vocal folds are unremarkable. Salivary glands: The right parotid gland again appears anteriorly displaced by the below described large collection in the right neck which is again felt unlikely to be parotid in origin. The right submandibular gland is also slightly anteriorly displaced. The left parotid and submandibular glands are normal. Thyroid:  Normal. Lymph nodes: There is no pathologic lymphadenopathy in the neck. Vascular: The right internal jugular vein is again not identified. The vasculature is otherwise unremarkable. Limited intracranial: The imaged portions of the intracranial compartment are unremarkable. Visualized orbits: Not evaluated. Mastoids and visualized paranasal sinuses: The imaged paranasal sinuses are clear. The imaged mastoid air cells are clear. Skeleton: There is no acute osseous abnormality or aggressive osseous lesion. Upper chest: The imaged lung apices are clear. Other: Again seen is a large peripherally enhancing fluid collection in the right neck centered at the level of the hyoid bone. The collection measures up to approximately 7.9 cm AP by 6.4 cm TV by 8.1 cm cc. Size comparison to the prior study is difficult due to the irregularity of the collection, but overall the size  appears slightly increased. The collection extends from posterior to the angle of the mandible superiorly into the sternocleidomastoid at the level of the thyroid inferiorly. Anteriorly, the collection abuts the submandibular gland. Posteriorly, the lesion extends to abut the paraspinal musculature. There is medial extent towards the right crus of the hyoid bone which appears slightly increased in extent from the prior study, and as above, edema in the adjacent pharyngeal mucosa is increased. There is significant inflammatory change in the surrounding soft tissues of the neck. IMPRESSION: Interval increase in size of the large peripherally enhancing collection in the right neck again consistent with a large abscess given recent drainage of purulent material. There is slightly increased medial extent at the level of the hyoid bone with increased edema in the oropharynx extending into the vallecula with new effacement of the right piriform sinus. Electronically Signed   By: Lesia Hausen M.D.   On: 04/12/2021 14:48     Labs on Admission: I have personally reviewed the available labs and imaging studies at the time of the admission.  Pertinent labs:  WBC: 28.5,  hgb: 10.7,  platelets: 937,  sodium: 130,  AST: 50, lactic acid: 1.7  Assessment/Plan Principal Problem:   Neck abscess 40 year old male with right sided neck abscess who was discharged home yesterday returning for increased swelling, pain and difficulty breathing.  -place in observation -Neck mass has enlarged to 7.9x6.4x8.1 and has increased medial extension into the crus of the hyoid bone. Edema in adjacent pharyngeal mucosa is increased. Significant inflammatory change in surrounding soft tissues of the neck  -blood cultures were negative final from last admit -ENT was consulted by EDP who recommended start clindamycin 900mg  IV and give 20mg  decadron. They will be by to see.  -will continue clindamycin 600mg  IV q 8 hours  -steroids per  ENT  -no stridors on exam and resting comfortably -NPO in case of procedure  -morphine for pain  -SCDs  -repeat blood cultures   Active Problems:   Elevated ALT (SGOT) -mild bump, continue to follow -repeat tomorrow     Hyponatremia At baseline, follow     Microcytic anemia -iron studies at last admit showed IDA -start oral iron once PO    Thrombocytosis  Likely reactive in setting of infection/anemia. Normal platelet count 7 days ago -smear pending   Alcohol abuse No alcohol as he was in hospital for 7 days with no withdrawal.  Continue MV, thiamin, folic acid   There is no height or weight on file to calculate BMI.  Level of care: Med-Surg DVT prophylaxis:  SCDs in case of procedure  Code Status:  Full - confirmed with patient Family Communication: None present  Disposition Plan:  The patient is from: home  Anticipated d/c is to: home   Patient placed in observation as anticipate less than 2 midnight stay. Requires hospitalization for worsening neck abscess, pain and difficulty breathing. Requires IV abx and MDM with specialists.    Patient is currently: stable  Consults called: ENT by ED   Admission status:  observation    Orland Mustard MD [facility]   How to contact the [facility] Attending or Consulting provider 7A - 7P or covering provider during after hours 7P -7A, for this patient?  Check the care team in [facility] and look for a) attending/consulting [facility] provider listed and b) the [facility] team listed Log into [email or web address] and use [place]'s universal password to access. If you do not have the password, please contact the hospital operator. Locate the [facility] provider you are looking for under [facility] and page to a number that you can be directly reached. If you still have difficulty reaching the provider, please page the [facility] (Director on Call) for the Hospitalists listed on amion for assistance.   04/12/2021, 8:17 PM

## 2021-04-12 NOTE — ED Provider Notes (Signed)
Emergency Medicine Provider Triage Evaluation Note  Ernest Jenkins , a 40 y.o. male  was evaluated in triage.  Pt complains of worsening neck abscess.  Discharged from the hospital yesterday.  Reports he was given oral prescription for clindamycin.  However, feels abscess is now crossing over to his midline.  Afebrile on arrival.  Review of Systems  Positive: Wound, dysphagia Negative: Fever  Physical Exam  BP 115/61 (BP Location: Right Arm)   Pulse (!) 55   Temp 98.9 F (37.2 C) (Oral)   Resp 18   SpO2 100%  Gen:   Awake, no distress   Resp:  Normal effort  MSK:   Moves extremities without difficulty  Other:  Large right-sided neck abscess noted with erythema, small head noted.  Trachea appears midline.  Medical Decision Making  Medically screening exam initiated at 1:51 PM.  Appropriate orders placed.  Ernest Jenkins was informed that the remainder of the evaluation will be completed by another provider, this initial triage assessment does not replace that evaluation, and the importance of remaining in the ED until their evaluation is complete.  Patient here with worsening abscess.  Oropharynx is visible, does endorse dysphagia.  Discharged from the hospital yesterday with clindamycin.  Prior CT soft tissue neck showed a 7.4 cm right upper neck abscess with extensive surrounding inflammation.  Looks like patient was to follow-up with ENT Dr. Danice Goltz.  Will obtain labs along with reimaging to further evaluate   Claude Manges, PA-C 04/12/21 1400    Horton, Clabe Seal, DO 04/13/21 1016

## 2021-04-13 ENCOUNTER — Encounter (HOSPITAL_COMMUNITY)
Admission: EM | Disposition: A | Discharge status: Home / Self Care | Payer: Self-pay | Source: Home / Self Care | Attending: Emergency Medicine

## 2021-04-13 ENCOUNTER — Observation Stay (HOSPITAL_COMMUNITY): Payer: Medicaid Other | Admitting: Anesthesiology

## 2021-04-13 DIAGNOSIS — D75839 Thrombocytosis, unspecified: Secondary | ICD-10-CM | POA: Diagnosis not present

## 2021-04-13 DIAGNOSIS — D509 Iron deficiency anemia, unspecified: Secondary | ICD-10-CM | POA: Diagnosis not present

## 2021-04-13 DIAGNOSIS — E871 Hypo-osmolality and hyponatremia: Secondary | ICD-10-CM | POA: Diagnosis not present

## 2021-04-13 DIAGNOSIS — I889 Nonspecific lymphadenitis, unspecified: Secondary | ICD-10-CM | POA: Diagnosis not present

## 2021-04-13 DIAGNOSIS — L0211 Cutaneous abscess of neck: Secondary | ICD-10-CM | POA: Diagnosis not present

## 2021-04-13 DIAGNOSIS — R739 Hyperglycemia, unspecified: Secondary | ICD-10-CM | POA: Diagnosis not present

## 2021-04-13 DIAGNOSIS — R0600 Dyspnea, unspecified: Secondary | ICD-10-CM | POA: Diagnosis not present

## 2021-04-13 DIAGNOSIS — Z79899 Other long term (current) drug therapy: Secondary | ICD-10-CM | POA: Diagnosis not present

## 2021-04-13 DIAGNOSIS — L0291 Cutaneous abscess, unspecified: Secondary | ICD-10-CM | POA: Diagnosis not present

## 2021-04-13 DIAGNOSIS — Z20822 Contact with and (suspected) exposure to covid-19: Secondary | ICD-10-CM | POA: Diagnosis not present

## 2021-04-13 DIAGNOSIS — R7401 Elevation of levels of liver transaminase levels: Secondary | ICD-10-CM

## 2021-04-13 HISTORY — PX: MASS BIOPSY: SHX5445

## 2021-04-13 LAB — CBC
HCT: 34.4 % — ABNORMAL LOW (ref 39.0–52.0)
Hemoglobin: 11.2 g/dL — ABNORMAL LOW (ref 13.0–17.0)
MCH: 22.9 pg — ABNORMAL LOW (ref 26.0–34.0)
MCHC: 32.6 g/dL (ref 30.0–36.0)
MCV: 70.2 fL — ABNORMAL LOW (ref 80.0–100.0)
Platelets: 856 10*3/uL — ABNORMAL HIGH (ref 150–400)
RBC: 4.9 MIL/uL (ref 4.22–5.81)
RDW: 13.7 % (ref 11.5–15.5)
WBC: 27.5 10*3/uL — ABNORMAL HIGH (ref 4.0–10.5)
nRBC: 0.1 % (ref 0.0–0.2)

## 2021-04-13 LAB — COMPREHENSIVE METABOLIC PANEL
ALT: 39 U/L (ref 0–44)
AST: 18 U/L (ref 15–41)
Albumin: 2.9 g/dL — ABNORMAL LOW (ref 3.5–5.0)
Alkaline Phosphatase: 60 U/L (ref 38–126)
Anion gap: 8 (ref 5–15)
BUN: 13 mg/dL (ref 6–20)
CO2: 26 mmol/L (ref 22–32)
Calcium: 9.2 mg/dL (ref 8.9–10.3)
Chloride: 97 mmol/L — ABNORMAL LOW (ref 98–111)
Creatinine, Ser: 0.72 mg/dL (ref 0.61–1.24)
GFR, Estimated: >60 mL/min (ref >60)
Glucose, Bld: 125 mg/dL — ABNORMAL HIGH (ref 70–99)
Potassium: 4.5 mmol/L (ref 3.5–5.1)
Sodium: 131 mmol/L — ABNORMAL LOW (ref 135–145)
Total Bilirubin: 0.3 mg/dL (ref 0.3–1.2)
Total Protein: 7 g/dL (ref 6.5–8.1)

## 2021-04-13 LAB — RESP PANEL BY RT-PCR (FLU A&B, COVID) ARPGX2
Influenza A by PCR: NEGATIVE
Influenza B by PCR: NEGATIVE
SARS Coronavirus 2 by RT PCR: NEGATIVE

## 2021-04-13 SURGERY — BIOPSY, MASS, NECK
Anesthesia: General | Laterality: Right

## 2021-04-13 MED ORDER — FENTANYL CITRATE (PF) 250 MCG/5ML IJ SOLN
INTRAMUSCULAR | Status: AC
  Filled 2021-04-13: qty 5

## 2021-04-13 MED ORDER — PROPOFOL 10 MG/ML IV BOLUS
INTRAVENOUS | Status: DC | PRN
  Administered 2021-04-13: 200 mg via INTRAVENOUS

## 2021-04-13 MED ORDER — OXYCODONE HCL 5 MG/5ML PO SOLN: 5.0000 mg | Freq: Once | ORAL | Status: DC | PRN

## 2021-04-13 MED ORDER — PROPOFOL 10 MG/ML IV BOLUS
INTRAVENOUS | Status: AC
  Filled 2021-04-13: qty 20

## 2021-04-13 MED ORDER — HYDROMORPHONE HCL 1 MG/ML IJ SOLN
0.2500 mg | INTRAMUSCULAR | Status: DC | PRN
  Administered 2021-04-13: 0.5 mg via INTRAVENOUS

## 2021-04-13 MED ORDER — FENTANYL CITRATE (PF) 100 MCG/2ML IJ SOLN
INTRAMUSCULAR | Status: DC | PRN
  Administered 2021-04-13: 100 ug via INTRAVENOUS
  Administered 2021-04-13: 50 ug via INTRAVENOUS
  Administered 2021-04-13: 100 ug via INTRAVENOUS

## 2021-04-13 MED ORDER — MIDAZOLAM HCL 2 MG/2ML IJ SOLN
INTRAMUSCULAR | Status: AC
  Filled 2021-04-13: qty 2

## 2021-04-13 MED ORDER — 0.9 % SODIUM CHLORIDE (POUR BTL) OPTIME
TOPICAL | Status: DC | PRN
  Administered 2021-04-13 (×2): 1000 mL

## 2021-04-13 MED ORDER — ACETAMINOPHEN 10 MG/ML IV SOLN
INTRAVENOUS | Status: DC | PRN
  Administered 2021-04-13: 1000 mg via INTRAVENOUS

## 2021-04-13 MED ORDER — ONDANSETRON HCL 4 MG/2ML IJ SOLN: 4.0000 mg | Freq: Once | INTRAMUSCULAR | Status: DC | PRN

## 2021-04-13 MED ORDER — LIDOCAINE 2% (20 MG/ML) 5 ML SYRINGE
INTRAMUSCULAR | Status: DC | PRN
  Administered 2021-04-13: 80 mg via INTRAVENOUS

## 2021-04-13 MED ORDER — CHLORHEXIDINE GLUCONATE 0.12 % MT SOLN
OROMUCOSAL | Status: AC
  Administered 2021-04-13: 15 mL via OROMUCOSAL
  Filled 2021-04-13: qty 15

## 2021-04-13 MED ORDER — DEXMEDETOMIDINE (PRECEDEX) IN NS 20 MCG/5ML (4 MCG/ML) IV SYRINGE
PREFILLED_SYRINGE | INTRAVENOUS | Status: DC | PRN
  Administered 2021-04-13: 12 ug via INTRAVENOUS

## 2021-04-13 MED ORDER — SODIUM CHLORIDE 0.9 % IV SOLN
INTRAVENOUS | Status: DC
  Filled 2021-04-13: qty 10

## 2021-04-13 MED ORDER — ORAL CARE MOUTH RINSE: 15.0000 mL | Freq: Once | OROMUCOSAL | Status: AC

## 2021-04-13 MED ORDER — SODIUM CHLORIDE 0.9 % IV SOLN: Freq: Once | INTRAVENOUS | Status: DC

## 2021-04-13 MED ORDER — SODIUM CHLORIDE 0.9 % IV SOLN: INTRAVENOUS | Status: DC | PRN

## 2021-04-13 MED ORDER — CHLORHEXIDINE GLUCONATE 0.12 % MT SOLN: 15.0000 mL | Freq: Once | OROMUCOSAL | Status: AC

## 2021-04-13 MED ORDER — LACTATED RINGERS IV SOLN: INTRAVENOUS | Status: DC

## 2021-04-13 MED ORDER — OXYCODONE HCL 5 MG PO TABS: 5.0000 mg | ORAL_TABLET | Freq: Once | ORAL | Status: DC | PRN

## 2021-04-13 MED ORDER — LACTATED RINGERS IV SOLN: INTRAVENOUS | Status: DC | PRN

## 2021-04-13 MED ORDER — ACETAMINOPHEN 10 MG/ML IV SOLN
INTRAVENOUS | Status: AC
  Filled 2021-04-13: qty 100

## 2021-04-13 MED ORDER — ENOXAPARIN SODIUM 40 MG/0.4ML IJ SOSY
40.0000 mg | PREFILLED_SYRINGE | INTRAMUSCULAR | Status: DC
  Administered 2021-04-13 – 2021-04-15 (×3): 40 mg via SUBCUTANEOUS
  Filled 2021-04-13 (×3): qty 0.4

## 2021-04-13 MED ORDER — SUCCINYLCHOLINE CHLORIDE 200 MG/10ML IV SOSY
PREFILLED_SYRINGE | INTRAVENOUS | Status: DC | PRN
  Administered 2021-04-13: 100 mg via INTRAVENOUS

## 2021-04-13 MED ORDER — ONDANSETRON HCL 4 MG/2ML IJ SOLN
INTRAMUSCULAR | Status: DC | PRN
  Administered 2021-04-13: 4 mg via INTRAVENOUS

## 2021-04-13 MED ORDER — HYDROMORPHONE HCL 1 MG/ML IJ SOLN
INTRAMUSCULAR | Status: AC
  Administered 2021-04-13: 0.5 mg via INTRAVENOUS
  Filled 2021-04-13: qty 1

## 2021-04-13 MED ORDER — MIDAZOLAM HCL 5 MG/5ML IJ SOLN
INTRAMUSCULAR | Status: DC | PRN
  Administered 2021-04-13: 2 mg via INTRAVENOUS

## 2021-04-13 SURGICAL SUPPLY — 33 items
BAG COUNTER SPONGE SURGICOUNT (BAG) ×2 IMPLANT
BLADE SURG 15 STRL LF DISP TIS (BLADE) IMPLANT
BLADE SURG 15 STRL SS (BLADE)
BNDG GAUZE ELAST 4 BULKY (GAUZE/BANDAGES/DRESSINGS) ×1 IMPLANT
CANISTER SUCT 3000ML PPV (MISCELLANEOUS) ×2 IMPLANT
CLEANER TIP ELECTROSURG 2X2 (MISCELLANEOUS) ×2 IMPLANT
COVER SURGICAL LIGHT HANDLE (MISCELLANEOUS) ×2 IMPLANT
DERMABOND ADVANCED (GAUZE/BANDAGES/DRESSINGS)
DERMABOND ADVANCED .7 DNX12 (GAUZE/BANDAGES/DRESSINGS) IMPLANT
DRAPE HALF SHEET 40X57 (DRAPES) IMPLANT
ELECT COATED BLADE 2.86 ST (ELECTRODE) IMPLANT
ELECT NDL BLADE 2-5/6 (NEEDLE) IMPLANT
ELECT NEEDLE BLADE 2-5/6 (NEEDLE) IMPLANT
ELECT REM PT RETURN 9FT ADLT (ELECTROSURGICAL) ×2
ELECTRODE REM PT RTRN 9FT ADLT (ELECTROSURGICAL) ×1 IMPLANT
GAUZE SPONGE 4X4 12PLY STRL (GAUZE/BANDAGES/DRESSINGS) ×2 IMPLANT
GAUZE XEROFORM 5X9 LF (GAUZE/BANDAGES/DRESSINGS) ×3 IMPLANT
GLOVE SURG LTX SZ7.5 (GLOVE) ×2 IMPLANT
GOWN STRL REUS W/ TWL LRG LVL3 (GOWN DISPOSABLE) ×2 IMPLANT
GOWN STRL REUS W/TWL LRG LVL3 (GOWN DISPOSABLE) ×2
KIT BASIN OR (CUSTOM PROCEDURE TRAY) ×2 IMPLANT
KIT TURNOVER KIT B (KITS) ×2 IMPLANT
NDL HYPO 25GX1X1/2 BEV (NEEDLE) IMPLANT
NEEDLE HYPO 25GX1X1/2 BEV (NEEDLE) IMPLANT
NS IRRIG 1000ML POUR BTL (IV SOLUTION) ×2 IMPLANT
PAD ARMBOARD 7.5X6 YLW CONV (MISCELLANEOUS) ×2 IMPLANT
PENCIL SMOKE EVACUATOR (MISCELLANEOUS) ×2 IMPLANT
SPECIMEN JAR SMALL (MISCELLANEOUS) ×1 IMPLANT
SUT SILK 2 0 (SUTURE)
SUT SILK 2-0 18XBRD TIE 12 (SUTURE) IMPLANT
TOWEL GREEN STERILE FF (TOWEL DISPOSABLE) ×2 IMPLANT
TRAY ENT MC OR (CUSTOM PROCEDURE TRAY) ×2 IMPLANT
WATER STERILE IRR 1000ML POUR (IV SOLUTION) ×2 IMPLANT

## 2021-04-13 NOTE — Op Note (Signed)
DATE OF PROCEDURE:  04/13/2021                              OPERATIVE REPORT  SURGEON:  Newman Pies, MD  PREOPERATIVE DIAGNOSES: 1.  Right neck abscess  POSTOPERATIVE DIAGNOSES: 1.  Right neck abscess  PROCEDURE PERFORMED: Incision and drainage of right neck abscess (CPT 21501)  ANESTHESIA:  General endotracheal tube anesthesia.  COMPLICATIONS:  None.  ESTIMATED BLOOD LOSS:  Minimal.  INDICATION FOR PROCEDURE:  Ernest Jenkins is a 40 y.o. male who was recently admitted for treatment of his right neck abscess.  He was treated with IV clindamycin and steroid with initial improvement in his symptoms.  After he was discharged home, he noted significant increase in his right neck swelling.  On his repeat CT scan, he was noted to have enlargement of the right neck abscess. Based on the above findings, the decision was made for the patient to undergo the incision and drainage procedure. Likelihood of success in reducing symptoms was also discussed.  The risks, benefits, alternatives, and details of the procedure were discussed with the patient.  Questions were invited and answered.  Informed consent was obtained.  DESCRIPTION:  The patient was taken to the operating room and placed supine on the operating table.  General endotracheal tube anesthesia was administered by the anesthesiologist.  The patient was positioned and prepped and draped in a standard fashion for right neck surgery.  The patient was noted to have a large right neck abscess.  A 5 cm vertical incision was made over the right lateral neck.  A large amount of purulent drainage was suctioned.  Cultures were obtained from the purulent drainage.  The abscess cavity was copiously irrigated with antibiotic solution.  The abscess cavity was packed with Xeroform packing.  Circumferential neck dressing was then applied.  The care of the patient was turned over to the anesthesiologist.  The patient was awakened from anesthesia without difficulty.   The patient was extubated and transferred to the recovery room in good condition.  OPERATIVE FINDINGS: A large right neck abscess.  SPECIMEN:  None  FOLLOWUP CARE:  The patient will be returned to his hospital bed.  He will continue his IV antibiotic.  Dahlia Nifong W Sharetta Ricchio 04/13/2021 11:29 AM

## 2021-04-13 NOTE — Progress Notes (Signed)
[facility]  PROGRESS NOTE  Vandy Tsuchiya VAN:[id number] DOB: [birth year] DOA: 04/12/2021 PCP: Default, Provider, MD   Brief HPI:   40 year old male with medical history of alcohol abuse who was recently discharged 2 days ago for right lateral neck abscess likely secondary to lymphadenitis presented back to ED after he woke up in the morning and felt neck was more swollen and had difficulty breathing.  He was taking medications he was prescribed at discharge.  No fever or chills.  No cough or chest pain.  Patient was admitted on 04/05/21 and discharged on 04/11/2021  Brief note on recent hospitalization from discharge summary by dr. Hanley Ben 40 year old male with history of alcohol abuse presented with right lateral neck mass and difficulty swallowing. Work up revealed large right lateral neck abscess measuring 7.4 x 5.5 x 5.6 cm by CT scan.  Started on empiric IV antibiotics IV vancomycin and Rocephin, ENT was consulted and followed.  Due to poor response IV vancomycin and Rocephin, antibiotics were switched to IV clindamycin and IV Decadron was added on 04/07/2021.  Blood cultures have been negative to date.  His symptomatology improved with IV clindamycin and IV Decadron.  Drainage of the right neck abscess on 04/10/2021 by ENT.  Approximately 10 cc of purulent fluid was evacuated.  Cultures have remained negative so far.  Patient's condition is improved with improving swelling.  Currently hemoglobin is stable.  ENT has cleared the patient for discharge on oral clindamycin with outpatient follow-up with ENT  Patient was seen by ENT and taken to the OR for incision and drainage of the right lateral neck abscess. Subjective   Patient seen, s/p incision and drainage of right lateral neck abscess.  Denies pain.   Assessment/Plan:     Neck abscess, s/p incision and drainage -Neck swelling is improved -Continue clindamycin 600 mg IV every 8 hours -Follow culture results  Transaminitis -LFTs  are normal this morning   Hyponatremia -Likely chronic, at baseline  Iron-deficiency anemia -Iron studies during last admission showed iron deficiency anemia -He will need GI  evaluation as outpatient  Thrombocytosis -Likely reactive from abscess -Follow peripheral blood smear  Alcohol abuse -No signs and symptoms of alcohol withdrawal  Hyperglycemia -Hemoglobin A1c was 5.2 on 04/09/2021   Medications     sodium chloride flush  3 mL Intravenous Q12H     Data Reviewed:   CBG:  Recent Labs  Lab 04/10/21 1120 04/10/21 1631 04/10/21 2111 04/11/21 0644 04/11/21 1147  GLUCAP 149* 189* 140* 135* 218*    SpO2: 100 % O2 Flow Rate (L/min): 2 L/min    Vitals:   04/13/21 1140 04/13/21 1150 04/13/21 1201 04/13/21 1220  BP:  107/79 109/67 118/72  Pulse: 68 65 75 63  Resp: 13 11 15 16   Temp:   98.3 F (36.8 C) 97.9 F (36.6 C)  TempSrc:      SpO2: 95% 98% 98% 100%  Weight:      Height:         Intake/Output Summary (Last 24 hours) at 04/13/2021 1543 Last data filed at 04/13/2021 1300 Gross per 24 hour  Intake 1060 ml  Output 1425 ml  Net -365 ml    11/30 1901 - 12/02 0700 In: 100  Out: 1400 [Urine:1400]  Filed Weights   04/13/21 0555  Weight: 69.5 kg    Data Reviewed: Basic Metabolic Panel: Recent Labs  Lab 04/08/21 0350 04/09/21 0411 04/11/21 0447 04/12/21 1401 04/13/21 0718  NA 132* 133* 129* 130* 131*  K 4.0 4.4 4.5 3.7 4.5  CL 97* 100 96* 95* 97*  CO2 GLUCOSE 165* 149* 140* 107* 125*  BUN CREATININE 0.90 0.82 0.83 0.82 0.72  CALCIUM 8.9 9.1 9.2 8.8* 9.2  MG  --   --  2.4  --   --   PHOS  --   --  3.9  --   --    Liver Function Tests: Recent Labs  Lab 04/12/21 1401 04/13/21 0718  AST 25 18  ALT 50* 39  ALKPHOS 51 60  BILITOT 0.5 0.3  PROT 6.6 7.0  ALBUMIN 2.9* 2.9*   No results for input(s): LIPASE, AMYLASE in the last 168 hours. No results for input(s): AMMONIA in the last 168  hours. CBC: Recent Labs  Lab 04/08/21 0350 04/09/21 0411 04/11/21 0447 04/12/21 1401 04/13/21 0718  WBC 21.9* 26.1* 20.5* 28.5* 27.5*  NEUTROABS  --   --   --  21.9*  --   HGB 10.5* 9.3* 10.8* 10.7* 11.2*  HCT 32.3* 29.2* 33.4* 34.6* 34.4*  MCV 69.6* 70.5* 70.2* 72.2* 70.2*  PLT 587* 709* 876* 937* 856*   Cardiac Enzymes: No results for input(s): CKTOTAL, CKMB, CKMBINDEX, TROPONINI in the last 168 hours. BNP (last 3 results) No results for input(s): BNP in the last 8760 hours.  ProBNP (last 3 results) No results for input(s): PROBNP in the last 8760 hours.  CBG: Recent Labs  Lab 04/10/21 1120 04/10/21 1631 04/10/21 2111 04/11/21 0644 04/11/21 1147  GLUCAP 149* 189* 140* 135* 218*       Radiology Reports  CT Soft Tissue Neck W Contrast  Result Date: 04/12/2021 CLINICAL DATA:  Neck abscess EXAM: CT NECK WITH CONTRAST TECHNIQUE: Multidetector CT imaging of the neck was performed using the standard protocol following the bolus administration of intravenous contrast. CONTRAST:  75mL OMNIPAQUE IOHEXOL 300 MG/ML  SOLN COMPARISON:  CT neck 04/05/2021 FINDINGS: Pharynx and larynx: The nasal cavity and nasopharynx are unremarkable. There is mucosal edema in the right oropharynx extending into the vallecula and hypopharynx/larynx. There is effacement of the right piriform sinus. This is slightly increased since the study from 04/05/2021. The vocal folds are unremarkable. Salivary glands: The right parotid gland again appears anteriorly displaced by the below described large collection in the right neck which is again felt unlikely to be parotid in origin. The right submandibular gland is also slightly anteriorly displaced. The left parotid and submandibular glands are normal. Thyroid: Normal. Lymph nodes: There is no pathologic lymphadenopathy in the neck. Vascular: The right internal jugular vein is again not identified. The vasculature is otherwise unremarkable. Limited intracranial:  The imaged portions of the intracranial compartment are unremarkable. Visualized orbits: Not evaluated. Mastoids and visualized paranasal sinuses: The imaged paranasal sinuses are clear. The imaged mastoid air cells are clear. Skeleton: There is no acute osseous abnormality or aggressive osseous lesion. Upper chest: The imaged lung apices are clear. Other: Again seen is a large peripherally enhancing fluid collection in the right neck centered at the level of the hyoid bone. The collection measures up to approximately 7.9 cm AP by 6.4 cm TV by 8.1 cm cc. Size comparison to the prior study is difficult due to the irregularity of the collection, but overall the size appears slightly increased. The collection extends from posterior to the angle of the mandible superiorly into the sternocleidomastoid at the level of the thyroid inferiorly. Anteriorly, the collection abuts the submandibular gland.  Posteriorly, the lesion extends to abut the paraspinal musculature. There is medial extent towards the right crus of the hyoid bone which appears slightly increased in extent from the prior study, and as above, edema in the adjacent pharyngeal mucosa is increased. There is significant inflammatory change in the surrounding soft tissues of the neck. IMPRESSION: Interval increase in size of the large peripherally enhancing collection in the right neck again consistent with a large abscess given recent drainage of purulent material. There is slightly increased medial extent at the level of the hyoid bone with increased edema in the oropharynx extending into the vallecula with new effacement of the right piriform sinus. Electronically Signed   By: Lesia Hausen M.D.   On: 04/12/2021 14:48       Antibiotics: Anti-infectives (From admission, onward)    Start     Dose/Rate Route Frequency Ordered Stop   04/13/21 1103  ceFAZolin 1 g / gentamicin 80 mg in NS 500 mL surgical irrigation  Status:  Discontinued          As needed  04/13/21 1103 04/13/21 1116   04/13/21 1045  gentamicin 80 mg/cefazolin 1000 mg in normal saline 1000 mL irrigation  Status:  Discontinued         Irrigation  Once 04/13/21 1031 04/13/21 1035   04/13/21 1045  ceFAZolin 1 g / gentamicin 80 mg in NS 500 mL surgical irrigation  Status:  Discontinued         Irrigation To Surgery 04/13/21 1037 04/13/21 1215   04/13/21 0600  clindamycin (CLEOCIN) IVPB 600 mg  Status:  Discontinued        600 mg 100 mL/hr over 30 Minutes Intravenous Every 8 hours 04/12/21 2031 04/12/21 2034   04/13/21 0000  clindamycin (CLEOCIN) IVPB 600 mg       Note to Pharmacy: 8 hours from last dose today   600 mg 100 mL/hr over 30 Minutes Intravenous Every 8 hours 04/12/21 2034     04/12/21 1830  clindamycin (CLEOCIN) IVPB 300 mg        300 mg 100 mL/hr over 30 Minutes Intravenous  Once 04/12/21 1819 04/12/21 2224   04/12/21 1800  clindamycin (CLEOCIN) IVPB 600 mg        600 mg 100 mL/hr over 30 Minutes Intravenous  Once 04/12/21 1756 04/12/21 1845         DVT prophylaxis: Lovenox  Code Status: Full code  Family Communication: No family at bedside   Consultants: ENT  Procedures:     Objective    Physical Examination:  General-appears in no acute distress Heart-S1-S2, regular, no murmur auscultated HEENT-dressing in place on anterior neck Lungs-clear to auscultation bilaterally, no wheezing or crackles auscultated Abdomen-soft, nontender, no organomegaly Extremities-no edema in the lower extremities Neuro-alert, oriented x3, no focal deficit noted  Status is: Inpatient  Dispo: The patient is from: Home              Anticipated d/c is to: Home              Anticipated d/c date is: 04/14/2021              Patient currently not stable for discharge  Barrier to discharge-s/p incision and drainage for right lateral neck abscess, on IV antibiotics  COVID-19 Labs  No results for input(s): DDIMER, FERRITIN, LDH, CRP in the last 72 hours.  Lab  Results  Component Value Date   SARSCOV2NAA NEGATIVE 04/13/2021   SARSCOV2NAA NEGATIVE 04/05/2021  Recent Results (from the past 240 hour(s))  Resp Panel by RT-PCR (Flu A&B, Covid) Nasopharyngeal Swab     Status: None   Collection Time: 04/05/21  1:55 PM   Specimen: Nasopharyngeal Swab; Nasopharyngeal(NP) swabs in vial transport medium  Result Value Ref Range Status   SARS Coronavirus 2 by RT PCR NEGATIVE NEGATIVE Final    Comment: (NOTE) SARS-CoV-2 target nucleic acids are NOT DETECTED.  The SARS-CoV-2 RNA is generally detectable in upper respiratory specimens during the acute phase of infection. The lowest concentration of SARS-CoV-2 viral copies this assay can detect is 138 copies/mL. A negative result does not preclude SARS-Cov-2 infection and should not be used as the sole basis for treatment or other patient management decisions. A negative result may occur with  improper specimen collection/handling, submission of specimen other than nasopharyngeal swab, presence of viral mutation(s) within the areas targeted by this assay, and inadequate number of viral copies(<138 copies/mL). A negative result must be combined with clinical observations, patient history, and epidemiological information. The expected result is Negative.  Fact Sheet for Patients:  BloggerCourse.com  Fact Sheet for Healthcare Providers:  SeriousBroker.it  This test is no t yet approved or cleared by the [place] FDA and  has been authorized for detection and/or diagnosis of SARS-CoV-2 by FDA under an Emergency Use Authorization (EUA). This EUA will remain  in effect (meaning this test can be used) for the duration of the COVID-19 declaration under Section 564(b)(1) of the Act, 21 U.S.C.section 360bbb-3(b)(1), unless the authorization is terminated  or revoked sooner.       Influenza A by PCR NEGATIVE NEGATIVE Final   Influenza B  by PCR NEGATIVE NEGATIVE Final    Comment: (NOTE) The Xpert Xpress SARS-CoV-2/FLU/RSV plus assay is intended as an aid in the diagnosis of influenza from Nasopharyngeal swab specimens and should not be used as a sole basis for treatment. Nasal washings and aspirates are unacceptable for Xpert Xpress SARS-CoV-2/FLU/RSV testing.  Fact Sheet for Patients: BloggerCourse.com  Fact Sheet for Healthcare Providers: SeriousBroker.it  This test is not yet approved or cleared by the [place] FDA and has been authorized for detection and/or diagnosis of SARS-CoV-2 by FDA under an Emergency Use Authorization (EUA). This EUA will remain in effect (meaning this test can be used) for the duration of the COVID-19 declaration under Section 564(b)(1) of the Act, 21 U.S.C. section 360bbb-3(b)(1), unless the authorization is terminated or revoked.  Performed at [facility], [address], [place], [place] [address]   Culture, blood (routine x 2)     Status: None   Collection Time: 04/05/21  2:11 PM   Specimen: BLOOD  Result Value Ref Range Status   Specimen Description BLOOD SITE NOT SPECIFIED  Final   Special Requests   Final    BOTTLES DRAWN AEROBIC AND ANAEROBIC Blood Culture results may not be optimal due to an inadequate volume of blood received in culture bottles   Culture   Final    NO GROWTH 5 DAYS Performed at [facility], [address] [place], [place] [address]    Report Status 04/10/2021 FINAL  Final  Culture, blood (routine x 2)     Status: None   Collection Time: 04/05/21  2:44 PM   Specimen: BLOOD  Result Value Ref Range Status   Specimen Description BLOOD RIGHT ANTECUBITAL  Final   Special Requests   Final    BOTTLES DRAWN AEROBIC AND ANAEROBIC Blood Culture adequate volume  Culture   Final    NO GROWTH 5 DAYS Performed at [facility], [address] [place], [place] [address]    Report Status  04/10/2021 FINAL  Final  Resp Panel by RT-PCR (Flu A&B, Covid) Nasopharyngeal Swab     Status: None   Collection Time: 04/13/21  4:02 AM   Specimen: Nasopharyngeal Swab; Nasopharyngeal(NP) swabs in vial transport medium  Result Value Ref Range Status   SARS Coronavirus 2 by RT PCR NEGATIVE NEGATIVE Final    Comment: (NOTE) SARS-CoV-2 target nucleic acids are NOT DETECTED.  The SARS-CoV-2 RNA is generally detectable in upper respiratory specimens during the acute phase of infection. The lowest concentration of SARS-CoV-2 viral copies this assay can detect is 138 copies/mL. A negative result does not preclude SARS-Cov-2 infection and should not be used as the sole basis for treatment or other patient management decisions. A negative result may occur with  improper specimen collection/handling, submission of specimen other than nasopharyngeal swab, presence of viral mutation(s) within the areas targeted by this assay, and inadequate number of viral copies(<138 copies/mL). A negative result must be combined with clinical observations, patient history, and epidemiological information. The expected result is Negative.  Fact Sheet for Patients:  BloggerCourse.com  Fact Sheet for Healthcare Providers:  SeriousBroker.it  This test is no t yet approved or cleared by the [place] FDA and  has been authorized for detection and/or diagnosis of SARS-CoV-2 by FDA under an Emergency Use Authorization (EUA). This EUA will remain  in effect (meaning this test can be used) for the duration of the COVID-19 declaration under Section 564(b)(1) of the Act, 21 U.S.C.section 360bbb-3(b)(1), unless the authorization is terminated  or revoked sooner.       Influenza A by PCR NEGATIVE NEGATIVE Final   Influenza B by PCR NEGATIVE NEGATIVE Final    Comment: (NOTE) The Xpert Xpress SARS-CoV-2/FLU/RSV plus assay is intended as an aid in the diagnosis of  influenza from Nasopharyngeal swab specimens and should not be used as a sole basis for treatment. Nasal washings and aspirates are unacceptable for Xpert Xpress SARS-CoV-2/FLU/RSV testing.  Fact Sheet for Patients: BloggerCourse.com  Fact Sheet for Healthcare Providers: SeriousBroker.it  This test is not yet approved or cleared by the [place] FDA and has been authorized for detection and/or diagnosis of SARS-CoV-2 by FDA under an Emergency Use Authorization (EUA). This EUA will remain in effect (meaning this test can be used) for the duration of the COVID-19 declaration under Section 564(b)(1) of the Act, 21 U.S.C. section 360bbb-3(b)(1), unless the authorization is terminated or revoked.  Performed at [facility], [address], [place], [place] [address]   Aerobic/Anaerobic Culture w Gram Stain (surgical/deep wound)     Status: None (Preliminary result)   Collection Time: 04/13/21 10:52 AM   Specimen: PATH Soft tissue  Result Value Ref Range Status   Specimen Description ABSCESS RIGHT NECK  Final   Special Requests NONE  Final   Gram Stain   Final    MODERATE WBC PRESENT, PREDOMINANTLY PMN NO ORGANISMS SEEN Performed at [facility], [address], [place], [place] [address]    Culture PENDING  Incomplete   Report Status PENDING  Incomplete    Meredeth Ide   [facility] If 7PM-7AM, please contact night-coverage at [email or web address], Office  [phone]   04/13/2021, 3:43 PM  LOS: 0 days

## 2021-04-13 NOTE — Progress Notes (Signed)
New Admission Note:   Arrival Method: from ED via Stretcher Mental Orientation: Alert and Oriented x4 Telemetry: N/A Assessment: to be Completed Skin: R neck abscess IV: LAC, nsl Pain: 7/10, pain medication to be given Tubes: None Safety Measures: Safety Fall Prevention Plan has been discussed  Admission: to be completed 5 [place] Orientation: Patient has been oriented to the room, unit and staff.   Family: none at bedside  Orders to be reviewed and implemented. Will continue to monitor the patient. Call light has been placed within reach and bed alarm has been activated.

## 2021-04-13 NOTE — Anesthesia Procedure Notes (Signed)
Procedure Name: Intubation Date/Time: 04/13/2021 10:46 AM Performed by: Ayesha Rumpf, CRNA Pre-anesthesia Checklist: Patient identified, Emergency Drugs available, Suction available and Patient being monitored Patient Re-evaluated:Patient Re-evaluated prior to induction Oxygen Delivery Method: Circle System Utilized Preoxygenation: Pre-oxygenation with 100% oxygen Induction Type: IV induction Laryngoscope Size: Glidescope and 4 Grade View: Grade I Tube type: Oral Tube size: 7.5 mm Number of attempts: 1 Airway Equipment and Method: Stylet and Oral airway Placement Confirmation: ETT inserted through vocal cords under direct vision, positive ETCO2 and breath sounds checked- equal and bilateral Secured at: 23 cm Tube secured with: Tape Dental Injury: Teeth and Oropharynx as per pre-operative assessment  Difficulty Due To: Difficulty was anticipated Comments: Pt with LARGE neck abcess.  Airway exam without current condition would likely yield simple airway.  Mask ventilation NOT attempted.

## 2021-04-13 NOTE — Anesthesia Postprocedure Evaluation (Signed)
Anesthesia Post Note  Patient: Ernest Jenkins  Procedure(s) Performed: Irrigation and Debridement Right Neck Abscess (Right)     Patient location during evaluation: PACU Anesthesia Type: General Level of consciousness: awake and alert, oriented and patient cooperative Pain management: pain level controlled Vital Signs Assessment: post-procedure vital signs reviewed and stable Respiratory status: spontaneous breathing, nonlabored ventilation and respiratory function stable Cardiovascular status: blood pressure returned to baseline and stable Postop Assessment: no apparent nausea or vomiting Anesthetic complications: no   No notable events documented.  Last Vitals:  Vitals:   04/13/21 0948 04/13/21 1120  BP: 116/80   Pulse: 93   Resp: 18   Temp: 36.8 C 36.5 C  SpO2: 100%     Last Pain:  Vitals:   04/13/21 1130  TempSrc:   PainSc: 7                  Lannie Fields

## 2021-04-13 NOTE — Interval H&P Note (Signed)
History and Physical Interval Note:  04/13/2021 7:07 AM  Ernest Jenkins  has presented today for surgery, with the diagnosis of Right Neck Abscess.  The various methods of treatment have been discussed with the patient. After consideration of risks, benefits and other options for treatment, the patient has consented to  Procedure(s): Irrigation and Debridement Right Neck Abscess (Right) as a surgical intervention.  The patient's history has been reviewed, patient examined, no change in status, stable for surgery.  I have reviewed the patient's chart and labs.  Questions were answered to the patient's satisfaction.     Tyah Acord Amado Nash

## 2021-04-13 NOTE — Progress Notes (Signed)
Pt off the unit to OR.  

## 2021-04-13 NOTE — Progress Notes (Signed)
Respiratory Panel sent to lab. Report was given to 84M RN.

## 2021-04-13 NOTE — Transfer of Care (Signed)
Immediate Anesthesia Transfer of Care Note  Patient: Ernest Jenkins  Procedure(s) Performed: Irrigation and Debridement Right Neck Abscess (Right)  Patient Location: PACU  Anesthesia Type:General  Level of Consciousness: drowsy, patient cooperative and responds to stimulation  Airway & Oxygen Therapy: Patient Spontanous Breathing  Post-op Assessment: Report given to RN, Post -op Vital signs reviewed and stable and Patient moving all extremities X 4  Post vital signs: Reviewed and stable  Last Vitals:  Vitals Value Taken Time  BP 114/82 04/13/21 1120  Temp    Pulse 75 04/13/21 1122  Resp 16 04/13/21 1122  SpO2 98 % 04/13/21 1122  Vitals shown include unvalidated device data.  Last Pain:  Vitals:   04/13/21 0948  TempSrc: Oral  PainSc:       Patients Stated Pain Goal: 0 (04/13/21 0527)  Complications: No notable events documented.

## 2021-04-13 NOTE — Anesthesia Preprocedure Evaluation (Addendum)
Anesthesia Evaluation  Patient identified by MRN, date of birth, ID band Patient awake    Reviewed: Allergy & Precautions, NPO status , Patient's Chart, lab work & pertinent test results  Airway Mallampati: II  TM Distance: >3 FB Neck ROM: Full   Comment: Good mouth opening despite neck mass, still able to palpate most of mandible on the side of the mass Dental  (+) Dental Advisory Given   Pulmonary neg pulmonary ROS,    Pulmonary exam normal breath sounds clear to auscultation       Cardiovascular negative cardio ROS Normal cardiovascular exam Rhythm:Regular Rate:Normal     Neuro/Psych negative neurological ROS  negative psych ROS   GI/Hepatic negative GI ROS, (+)     substance abuse (etoh abuse)  alcohol use,   Endo/Other  negative endocrine ROS  Renal/GU negative Renal ROS  negative genitourinary   Musculoskeletal negative musculoskeletal ROS (+)   Abdominal   Peds  Hematology negative hematology ROS (+) hct 34.6, plt 937   Anesthesia Other Findings Right neck abscess-  discharged yesterday for right lateral neck abscess possibly secondary to lymphadenitis. He presents back to ED today stating that he woke up this morning and felt like his neck was more swollen and he had some difficulty breathing. Did not pick up prescribed ABx  CT: Interval increase in size of the large peripherally enhancing collection in the right neck again consistent with a large abscess given recent drainage of purulent material. There is slightly increased medial extent at the level of the hyoid bone with increased edema in the oropharynx extending into the vallecula with new effacement of the right piriform sinus.  Reproductive/Obstetrics negative OB ROS                            Anesthesia Physical Anesthesia Plan  ASA: 3  Anesthesia Plan: General   Post-op Pain Management:    Induction: Intravenous  and Rapid sequence  PONV Risk Score and Plan: 3 and Ondansetron, Dexamethasone, Midazolam and Treatment may vary due to age or medical condition  Airway Management Planned: Oral ETT and Video Laryngoscope Planned  Additional Equipment: None  Intra-op Plan:   Post-operative Plan: Extubation in OR  Informed Consent: I have reviewed the patients History and Physical, chart, labs and discussed the procedure including the risks, benefits and alternatives for the proposed anesthesia with the patient or authorized representative who has indicated his/her understanding and acceptance.     Dental advisory given  Plan Discussed with: CRNA  Anesthesia Plan Comments: (RSI w/ smaller ETTs available if needed but CT scan reassuring. D/w Dr. Teoh will proceed w/ glidescope RSI, tracheostomy kit available in room.)       Anesthesia Quick Evaluation  

## 2021-04-14 ENCOUNTER — Encounter (HOSPITAL_COMMUNITY): Payer: Self-pay | Admitting: Otolaryngology

## 2021-04-14 NOTE — Progress Notes (Signed)
PROGRESS NOTE    Ernest Jenkins  RUE:[id number] DOB: Jan 07, 1981 DOA: 04/12/2021 PCP: Default, Provider, MD (   Brief Narrative:  40 years old male with past medical history of alcohol abuse recently discharged 2 days ago for right lateral neck abscess came with a chief complaint of worsening of the abscess and felt like was more swollen and had difficulty breathing. Patient admitted for surgery and started empirical antibiotic clindamycin. Patient had a right neck abscess day 1 status post I&D Will leave packing in place until Monday Continue antibiotic   Assessment & Plan:  Right neck abscess Status post I&D Followed by ENT, continue antibiotics White count 27,000, platelets 856 Sodium 131  Hyponatremia chronic Like his baseline  Thrombocytosis Reactive to infection  Chronic alcohol abuse No signs of withdrawal   Principal Problem:   Neck abscess Active Problems:   Hyponatremia   Microcytic anemia   Thrombocytosis   Elevated AST (SGOT)      DVT prophylaxis: Lovenox Code Status: Full code Family Communication: Family in the room Disposition Plan: (2 to 3 days   Consultants:  ENT dr Newman Pies  Procedures:  I&D  Antimicrobials: Clindamycin  Subjective: Doing well no complaints afebrile  Objective: Vitals:   04/13/21 1651 04/13/21 2048 04/14/21 0353 04/14/21 0924  BP: 117/66 109/65 106/75 120/69  Pulse: 71 75 74 78  Resp: Temp: 98.1 F (36.7 C) 98.3 F (36.8 C) 98.3 F (36.8 C) 98.2 F (36.8 C)  TempSrc: Oral Oral Oral   SpO2: 99% 100% 98% 100%  Weight:      Height:        Intake/Output Summary (Last 24 hours) at 04/14/2021 1525 Last data filed at 04/14/2021 1235 Gross per 24 hour  Intake 1090 ml  Output 452 ml  Net 638 ml   Filed Weights   04/13/21 0555  Weight: 69.5 kg    Examination:  General exam: Appears calm and comfortable  Respiratory system: Clear to auscultation. Respiratory effort normal. Cardiovascular  system: S1 & S2 heard, RRR. No JVD, murmurs, rubs, gallops or clicks. No pedal edema. Gastrointestinal system: Abdomen is nondistended, soft and nontender. No organomegaly or masses felt. Normal bowel sounds heard. Central nervous system: Alert and oriented. No focal neurological deficits. Extremities: Symmetric 5 x 5 power. Skin: Dressing around his neck Psychiatry: Judgement and insight appear normal. Mood & affect appropriate.     Data Reviewed: I have personally reviewed following labs and imaging studies  CBC: Recent Labs  Lab 04/08/21 0350 04/09/21 0411 04/11/21 0447 04/12/21 1401 04/13/21 0718  WBC 21.9* 26.1* 20.5* 28.5* 27.5*  NEUTROABS  --   --   --  21.9*  --   HGB 10.5* 9.3* 10.8* 10.7* 11.2*  HCT 32.3* 29.2* 33.4* 34.6* 34.4*  MCV 69.6* 70.5* 70.2* 72.2* 70.2*  PLT 587* 709* 876* 937* 856*   Basic Metabolic Panel: Recent Labs  Lab 04/08/21 0350 04/09/21 0411 04/11/21 0447 04/12/21 1401 04/13/21 0718  NA 132* 133* 129* 130* 131*  K 4.0 4.4 4.5 3.7 4.5  CL 97* 100 96* 95* 97*  CO2 GLUCOSE 165* 149* 140* 107* 125*  BUN CREATININE 0.90 0.82 0.83 0.82 0.72  CALCIUM 8.9 9.1 9.2 8.8* 9.2  MG  --   --  2.4  --   --   PHOS  --   --  3.9  --   --    GFR: Estimated  Creatinine Clearance: 118.2 mL/min (by C-G formula based on SCr of 0.72 mg/dL). Liver Function Tests: Recent Labs  Lab 04/12/21 1401 04/13/21 0718  AST 25 18  ALT 50* 39  ALKPHOS 51 60  BILITOT 0.5 0.3  PROT 6.6 7.0  ALBUMIN 2.9* 2.9*   No results for input(s): LIPASE, AMYLASE in the last 168 hours. No results for input(s): AMMONIA in the last 168 hours. Coagulation Profile: No results for input(s): INR, PROTIME in the last 168 hours. Cardiac Enzymes: No results for input(s): CKTOTAL, CKMB, CKMBINDEX, TROPONINI in the last 168 hours. BNP (last 3 results) No results for input(s): PROBNP in the last 8760 hours. HbA1C: No results for input(s): HGBA1C in the  last 72 hours. CBG: Recent Labs  Lab 04/10/21 1120 04/10/21 1631 04/10/21 2111 04/11/21 0644 04/11/21 1147  GLUCAP 149* 189* 140* 135* 218*   Lipid Profile: No results for input(s): CHOL, HDL, LDLCALC, TRIG, CHOLHDL, LDLDIRECT in the last 72 hours. Thyroid Function Tests: No results for input(s): TSH, T4TOTAL, FREET4, T3FREE, THYROIDAB in the last 72 hours. Anemia Panel: No results for input(s): VITAMINB12, FOLATE, FERRITIN, TIBC, IRON, RETICCTPCT in the last 72 hours. Sepsis Labs: Recent Labs  Lab 04/12/21 1401 04/12/21 1601  LATICACIDVEN 1.7 1.6    Recent Results (from the past 240 hour(s))  Resp Panel by RT-PCR (Flu A&B, Covid) Nasopharyngeal Swab     Status: None   Collection Time: 04/05/21  1:55 PM   Specimen: Nasopharyngeal Swab; Nasopharyngeal(NP) swabs in vial transport medium  Result Value Ref Range Status   SARS Coronavirus 2 by RT PCR NEGATIVE NEGATIVE Final    Comment: (NOTE) SARS-CoV-2 target nucleic acids are NOT DETECTED.  The SARS-CoV-2 RNA is generally detectable in upper respiratory specimens during the acute phase of infection. The lowest concentration of SARS-CoV-2 viral copies this assay can detect is 138 copies/mL. A negative result does not preclude SARS-Cov-2 infection and should not be used as the sole basis for treatment or other patient management decisions. A negative result may occur with  improper specimen collection/handling, submission of specimen other than nasopharyngeal swab, presence of viral mutation(s) within the areas targeted by this assay, and inadequate number of viral copies(<138 copies/mL). A negative result must be combined with clinical observations, patient history, and epidemiological information. The expected result is Negative.  Fact Sheet for Patients:  BloggerCourse.com  Fact Sheet for Healthcare Providers:  SeriousBroker.it  This test is no t yet approved or  cleared by the [place] FDA and  has been authorized for detection and/or diagnosis of SARS-CoV-2 by FDA under an Emergency Use Authorization (EUA). This EUA will remain  in effect (meaning this test can be used) for the duration of the COVID-19 declaration under Section 564(b)(1) of the Act, 21 U.S.C.section 360bbb-3(b)(1), unless the authorization is terminated  or revoked sooner.       Influenza A by PCR NEGATIVE NEGATIVE Final   Influenza B by PCR NEGATIVE NEGATIVE Final    Comment: (NOTE) The Xpert Xpress SARS-CoV-2/FLU/RSV plus assay is intended as an aid in the diagnosis of influenza from Nasopharyngeal swab specimens and should not be used as a sole basis for treatment. Nasal washings and aspirates are unacceptable for Xpert Xpress SARS-CoV-2/FLU/RSV testing.  Fact Sheet for Patients: BloggerCourse.com  Fact Sheet for Healthcare Providers: SeriousBroker.it  This test is not yet approved or cleared by the [place] FDA and has been authorized for detection and/or diagnosis of SARS-CoV-2 by FDA under an Emergency Use Authorization (EUA). This  EUA will remain in effect (meaning this test can be used) for the duration of the COVID-19 declaration under Section 564(b)(1) of the Act, 21 U.S.C. section 360bbb-3(b)(1), unless the authorization is terminated or revoked.  Performed at [facility], [address], [place], [place] [address]   Culture, blood (routine x 2)     Status: None   Collection Time: 04/05/21  2:11 PM   Specimen: BLOOD  Result Value Ref Range Status   Specimen Description BLOOD SITE NOT SPECIFIED  Final   Special Requests   Final    BOTTLES DRAWN AEROBIC AND ANAEROBIC Blood Culture results may not be optimal due to an inadequate volume of blood received in culture bottles   Culture   Final    NO GROWTH 5 DAYS Performed at [facility], [address] [place], [place] [address]     Report Status 04/10/2021 FINAL  Final  Culture, blood (routine x 2)     Status: None   Collection Time: 04/05/21  2:44 PM   Specimen: BLOOD  Result Value Ref Range Status   Specimen Description BLOOD RIGHT ANTECUBITAL  Final   Special Requests   Final    BOTTLES DRAWN AEROBIC AND ANAEROBIC Blood Culture adequate volume   Culture   Final    NO GROWTH 5 DAYS Performed at [facility], [address], [place], [place] [address]    Report Status 04/10/2021 FINAL  Final  Resp Panel by RT-PCR (Flu A&B, Covid) Nasopharyngeal Swab     Status: None   Collection Time: 04/13/21  4:02 AM   Specimen: Nasopharyngeal Swab; Nasopharyngeal(NP) swabs in vial transport medium  Result Value Ref Range Status   SARS Coronavirus 2 by RT PCR NEGATIVE NEGATIVE Final    Comment: (NOTE) SARS-CoV-2 target nucleic acids are NOT DETECTED.  The SARS-CoV-2 RNA is generally detectable in upper respiratory specimens during the acute phase of infection. The lowest concentration of SARS-CoV-2 viral copies this assay can detect is 138 copies/mL. A negative result does not preclude SARS-Cov-2 infection and should not be used as the sole basis for treatment or other patient management decisions. A negative result may occur with  improper specimen collection/handling, submission of specimen other than nasopharyngeal swab, presence of viral mutation(s) within the areas targeted by this assay, and inadequate number of viral copies(<138 copies/mL). A negative result must be combined with clinical observations, patient history, and epidemiological information. The expected result is Negative.  Fact Sheet for Patients:  BloggerCourse.com  Fact Sheet for Healthcare Providers:  SeriousBroker.it  This test is no t yet approved or cleared by the [place] FDA and  has been authorized for detection and/or diagnosis of SARS-CoV-2 by FDA under an Emergency Use  Authorization (EUA). This EUA will remain  in effect (meaning this test can be used) for the duration of the COVID-19 declaration under Section 564(b)(1) of the Act, 21 U.S.C.section 360bbb-3(b)(1), unless the authorization is terminated  or revoked sooner.       Influenza A by PCR NEGATIVE NEGATIVE Final   Influenza B by PCR NEGATIVE NEGATIVE Final    Comment: (NOTE) The Xpert Xpress SARS-CoV-2/FLU/RSV plus assay is intended as an aid in the diagnosis of influenza from Nasopharyngeal swab specimens and should not be used as a sole basis for treatment. Nasal washings and aspirates are unacceptable for Xpert Xpress SARS-CoV-2/FLU/RSV testing.  Fact Sheet for Patients: BloggerCourse.com  Fact Sheet for Healthcare Providers: SeriousBroker.it  This test is not  yet approved or cleared by the [place] and has been authorized for detection and/or diagnosis of SARS-CoV-2 by FDA under an Emergency Use Authorization (EUA). This EUA will remain in effect (meaning this test can be used) for the duration of the COVID-19 declaration under Section 564(b)(1) of the Act, 21 U.S.C. section 360bbb-3(b)(1), unless the authorization is terminated or revoked.  Performed at [facility], [address], [place], [place] [address]   Culture, blood (routine x 2)     Status: None (Preliminary result)   Collection Time: 04/13/21  7:18 AM   Specimen: BLOOD  Result Value Ref Range Status   Specimen Description BLOOD RIGHT ANTECUBITAL  Final   Special Requests   Final    BOTTLES DRAWN AEROBIC AND ANAEROBIC Blood Culture results may not be optimal due to an inadequate volume of blood received in culture bottles   Culture   Final    NO GROWTH < 24 HOURS Performed at [facility], [address], [place], [place] [address]    Report Status PENDING  Incomplete  Culture, blood (routine x 2)     Status: None (Preliminary result)    Collection Time: 04/13/21  7:18 AM   Specimen: BLOOD RIGHT ARM  Result Value Ref Range Status   Specimen Description BLOOD RIGHT ARM  Final   Special Requests   Final    BOTTLES DRAWN AEROBIC AND ANAEROBIC Blood Culture adequate volume   Culture   Final    NO GROWTH < 24 HOURS Performed at [facility], [address] [place], [place] [address]    Report Status PENDING  Incomplete  Aerobic/Anaerobic Culture w Gram Stain (surgical/deep wound)     Status: None (Preliminary result)   Collection Time: 04/13/21 10:52 AM   Specimen: PATH Soft tissue  Result Value Ref Range Status   Specimen Description ABSCESS RIGHT NECK  Final   Special Requests NONE  Final   Gram Stain   Final    MODERATE WBC PRESENT, PREDOMINANTLY PMN NO ORGANISMS SEEN    Culture   Final    NO GROWTH < 24 HOURS Performed at [facility], [address] [place], [place] [address]    Report Status PENDING  Incomplete         Radiology Studies: No results found.      Scheduled Meds:  enoxaparin (LOVENOX) injection  40 mg Subcutaneous Q24H   sodium chloride flush  3 mL Intravenous Q12H   Continuous Infusions:  sodium chloride     clindamycin (CLEOCIN) IV 600 mg (04/14/21 1219)     LOS: 1 day    Time spent: More than 35 minutes    Macoy Rodwell G Tatiana Courter, MD [facility]   If 7PM-7AM, please contact night-coverage [email or web address] Password [email or web address] 04/14/2021, 3:25 PM

## 2021-04-14 NOTE — Plan of Care (Signed)
  Problem: Nutrition: Goal: Adequate nutrition will be maintained Outcome: Adequate for Discharge   

## 2021-04-14 NOTE — Plan of Care (Signed)

## 2021-04-14 NOTE — Progress Notes (Signed)
Subjective: Pt c/o some right neck pain. Tolerated oral intake. No breathing difficulty.  Objective: Vital signs in last 24 hours: Temp:  [97.7 F (36.5 C)-98.9 F (37.2 C)] 98.3 F (36.8 C) (12/03 0353) Pulse Rate:  [63-93] 74 (12/03 0353) Resp:  [11-18] 17 (12/03 0353) BP: (106-118)/(65-80) 106/75 (12/03 0353) SpO2:  [95 %-100 %] 98 % (12/03 0353) FiO2 (%):  [21 %] 21 % (12/02 1125)  Physical Exam: General appearance: alert, cooperative, and appears stated age Head: Normocephalic, without obvious abnormality, atraumatic Eyes: Pupils are equal, round, reactive to light. Extraocular motion is intact.  Ears: Examination of the ears shows normal auricles and external auditory canals bilaterally.   Nose: Nasal examination shows normal mucosa, septum, turbinates.  Face: Facial examination shows no asymmetry. Palpation of the face elicit no significant tenderness.  Mouth: Oral cavity examination shows no mucosal abnormalities. No significant trismus is noted.  Neck: Right neck packing in place. Edema has decreased. Neuro: Cranial nerves 2-12 are all grossly in tact.   Recent Labs    04/12/21 1401 04/13/21 0718  WBC 28.5* 27.5*  HGB 10.7* 11.2*  HCT 34.6* 34.4*  PLT 937* 856*   Recent Labs    04/12/21 1401 04/13/21 0718  NA 130* 131*  K 3.7 4.5  CL 95* 97*  CO2 26 26  GLUCOSE 107* 125*  BUN 16 13  CREATININE 0.82 0.72  CALCIUM 8.8* 9.2    Medications: I have reviewed the patient's current medications. Scheduled:  enoxaparin (LOVENOX) injection  40 mg Subcutaneous Q24H   sodium chloride flush  3 mL Intravenous Q12H   Continuous:  sodium chloride     clindamycin (CLEOCIN) IV 600 mg (04/14/21 0357)    Assessment/Plan: Right neck abscess - POD 1 s/p I&D - Will leave packing in place until Monday. - Continue abx.   LOS: 1 day   Averleigh Savary W Giomar Gusler 04/14/2021, 7:06 AM

## 2021-04-14 NOTE — Plan of Care (Signed)

## 2021-04-15 LAB — CBC
HCT: 33 % — ABNORMAL LOW (ref 39.0–52.0)
Hemoglobin: 10.5 g/dL — ABNORMAL LOW (ref 13.0–17.0)
MCH: 22.7 pg — ABNORMAL LOW (ref 26.0–34.0)
MCHC: 31.8 g/dL (ref 30.0–36.0)
MCV: 71.3 fL — ABNORMAL LOW (ref 80.0–100.0)
Platelets: 771 10*3/uL — ABNORMAL HIGH (ref 150–400)
RBC: 4.63 MIL/uL (ref 4.22–5.81)
RDW: 14.4 % (ref 11.5–15.5)
WBC: 8.7 10*3/uL (ref 4.0–10.5)
nRBC: 0 % (ref 0.0–0.2)

## 2021-04-15 LAB — COMPREHENSIVE METABOLIC PANEL
ALT: 38 U/L (ref 0–44)
AST: 25 U/L (ref 15–41)
Albumin: 2.8 g/dL — ABNORMAL LOW (ref 3.5–5.0)
Alkaline Phosphatase: 62 U/L (ref 38–126)
Anion gap: 10 (ref 5–15)
BUN: 21 mg/dL — ABNORMAL HIGH (ref 6–20)
CO2: 28 mmol/L (ref 22–32)
Calcium: 9 mg/dL (ref 8.9–10.3)
Chloride: 96 mmol/L — ABNORMAL LOW (ref 98–111)
Creatinine, Ser: 0.93 mg/dL (ref 0.61–1.24)
GFR, Estimated: >60 mL/min (ref >60)
Glucose, Bld: 89 mg/dL (ref 70–99)
Potassium: 4.1 mmol/L (ref 3.5–5.1)
Sodium: 134 mmol/L — ABNORMAL LOW (ref 135–145)
Total Bilirubin: 0.3 mg/dL (ref 0.3–1.2)
Total Protein: 6.3 g/dL — ABNORMAL LOW (ref 6.5–8.1)

## 2021-04-15 NOTE — Plan of Care (Signed)
  Problem: Clinical Measurements: Goal: Ability to maintain clinical measurements within normal limits will improve Outcome: Progressing   

## 2021-04-15 NOTE — Progress Notes (Addendum)
PROGRESS NOTE    Ernest Jenkins  AQT:[id number] DOB: 03-Aug-1980 DOA: 04/12/2021 PCP: Default, Provider, MD (   Brief Narrative:  40 years old male with past medical history of alcohol abuse recently discharged 2 days ago for right lateral neck abscess came with a chief complaint of worsening of the abscess and felt like was more swollen and had difficulty breathing. Patient admitted for surgery and started empirical antibiotic clindamycin. Patient had a right neck abscess day 1 status post I&D Will leave packing in place until Monday Continue antibiotic   Assessment & Plan:  Right neck abscess Status post I&D A febrile Followed by ENT, continue antibiotics Yesterday White count 27,000, platelets 856 Sodium 131 Plan to remove the packing and be discharged tomorrow if okay with ENT surgery and depends of the white count afebrile with normal and Resume clindamycin p.o. Follow white count and platelets CBC CMP pending today   Hyponatremia chronic Like his baseline around 130   Thrombocytosis Reactive to infection   Chronic alcohol abuse No signs of withdrawal     Principal Problem:   Neck abscess Active Problems:   Hyponatremia   Microcytic anemia   Thrombocytosis   Elevated AST (SGOT)      DVT prophylaxis: Lovenox Code Status: (Full/ Family Communication: No family in the room Disposition Plan:  To be discharged tomorrow after he is evaluated by ENT and packing removal  Consultants:  ENT surgery    Antimicrobials: (specify start and planned stop date. Auto populated tables are space occupying and do not give end dates) Clindamycin   Subjective: No complaints, no pain, no shortness of breath  Objective: Vitals:   04/14/21 1623 04/14/21 2027 04/15/21 0459 04/15/21 0907  BP: 114/71 117/73 104/65 110/63  Pulse: 76 64 76 78  Resp: 18 17 18 18   Temp: 98.5 F (36.9 C) 98.2 F (36.8 C) 98.4 F (36.9 C) 98.4 F (36.9 C)  TempSrc: Oral  Oral Oral   SpO2: 100% 100% 100% 100%  Weight:      Height:        Intake/Output Summary (Last 24 hours) at 04/15/2021 1446 Last data filed at 04/15/2021 1254 Gross per 24 hour  Intake 1060 ml  Output 0 ml  Net 1060 ml   Filed Weights   04/13/21 0555  Weight: 69.5 kg    Examination:  General exam: Appears calm and comfortable  Respiratory system: Clear to auscultation. Respiratory effort normal. Cardiovascular system: S1 & S2 heard, RRR. No JVD, murmurs, rubs, gallops or clicks. No pedal edema. Gastrointestinal system: Abdomen is nondistended, soft and nontender. No organomegaly or masses felt. Normal bowel sounds heard. Central nervous system: Alert and oriented. No focal neurological deficits. Extremities: Symmetric 5 x 5 power. Skin: No rashes, lesions or ulcers Psychiatry: Judgement and insight appear normal. Mood & affect appropriate.     Data Reviewed: I have personally reviewed following labs and imaging studies  CBC: Recent Labs  Lab 04/09/21 0411 04/11/21 0447 04/12/21 1401 04/13/21 0718  WBC 26.1* 20.5* 28.5* 27.5*  NEUTROABS  --   --  21.9*  --   HGB 9.3* 10.8* 10.7* 11.2*  HCT 29.2* 33.4* 34.6* 34.4*  MCV 70.5* 70.2* 72.2* 70.2*  PLT 709* 876* 937* 856*   Basic Metabolic Panel: Recent Labs  Lab 04/09/21 0411 04/11/21 0447 04/12/21 1401 04/13/21 0718  NA 133* 129* 130* 131*  K 4.4 4.5 3.7 4.5  CL 100 96* 95* 97*  CO2 25 26 26 26   GLUCOSE 149*  140* 107* 125*  BUN 15 16 16 13   CREATININE 0.82 0.83 0.82 0.72  CALCIUM 9.1 9.2 8.8* 9.2  MG  --  2.4  --   --   PHOS  --  3.9  --   --    GFR: Estimated Creatinine Clearance: 118.2 mL/min (by C-G formula based on SCr of 0.72 mg/dL). Liver Function Tests: Recent Labs  Lab 04/12/21 1401 04/13/21 0718  AST 25 18  ALT 50* 39  ALKPHOS 51 60  BILITOT 0.5 0.3  PROT 6.6 7.0  ALBUMIN 2.9* 2.9*   No results for input(s): LIPASE, AMYLASE in the last 168 hours. No results for input(s): AMMONIA in the last 168  hours. Coagulation Profile: No results for input(s): INR, PROTIME in the last 168 hours. Cardiac Enzymes: No results for input(s): CKTOTAL, CKMB, CKMBINDEX, TROPONINI in the last 168 hours. BNP (last 3 results) No results for input(s): PROBNP in the last 8760 hours. HbA1C: No results for input(s): HGBA1C in the last 72 hours. CBG: Recent Labs  Lab 04/10/21 1120 04/10/21 1631 04/10/21 2111 04/11/21 0644 04/11/21 1147  GLUCAP 149* 189* 140* 135* 218*   Lipid Profile: No results for input(s): CHOL, HDL, LDLCALC, TRIG, CHOLHDL, LDLDIRECT in the last 72 hours. Thyroid Function Tests: No results for input(s): TSH, T4TOTAL, FREET4, T3FREE, THYROIDAB in the last 72 hours. Anemia Panel: No results for input(s): VITAMINB12, FOLATE, FERRITIN, TIBC, IRON, RETICCTPCT in the last 72 hours. Sepsis Labs: Recent Labs  Lab 04/12/21 1401 04/12/21 1601  LATICACIDVEN 1.7 1.6    Recent Results (from the past 240 hour(s))  Resp Panel by RT-PCR (Flu A&B, Covid) Nasopharyngeal Swab     Status: None   Collection Time: 04/13/21  4:02 AM   Specimen: Nasopharyngeal Swab; Nasopharyngeal(NP) swabs in vial transport medium  Result Value Ref Range Status   SARS Coronavirus 2 by RT PCR NEGATIVE NEGATIVE Final    Comment: (NOTE) SARS-CoV-2 target nucleic acids are NOT DETECTED.  The SARS-CoV-2 RNA is generally detectable in upper respiratory specimens during the acute phase of infection. The lowest concentration of SARS-CoV-2 viral copies this assay can detect is 138 copies/mL. A negative result does not preclude SARS-Cov-2 infection and should not be used as the sole basis for treatment or other patient management decisions. A negative result may occur with  improper specimen collection/handling, submission of specimen other than nasopharyngeal swab, presence of viral mutation(s) within the areas targeted by this assay, and inadequate number of viral copies(<138 copies/mL). A negative result must be  combined with clinical observations, patient history, and epidemiological information. The expected result is Negative.  Fact Sheet for Patients:  14/02/22  Fact Sheet for Healthcare Providers:  BloggerCourse.com  This test is no t yet approved or cleared by the [employer] FDA and  has been authorized for detection and/or diagnosis of SARS-CoV-2 by FDA under an Emergency Use Authorization (EUA). This EUA will remain  in effect (meaning this test can be used) for the duration of the COVID-19 declaration under Section 564(b)(1) of the Act, 21 U.S.C.section 360bbb-3(b)(1), unless the authorization is terminated  or revoked sooner.       Influenza A by PCR NEGATIVE NEGATIVE Final   Influenza B by PCR NEGATIVE NEGATIVE Final    Comment: (NOTE) The Xpert Xpress SARS-CoV-2/FLU/RSV plus assay is intended as an aid in the diagnosis of influenza from Nasopharyngeal swab specimens and should not be used as a sole basis for treatment. Nasal washings and aspirates are unacceptable for Xpert  Xpress SARS-CoV-2/FLU/RSV testing.  Fact Sheet for Patients: BloggerCourse.com  Fact Sheet for Healthcare Providers: [employer]  This test is not yet approved or cleared by the [place] FDA and has been authorized for detection and/or diagnosis of SARS-CoV-2 by FDA under an Emergency Use Authorization (EUA). This EUA will remain in effect (meaning this test can be used) for the duration of the COVID-19 declaration under Section 564(b)(1) of the Act, 21 U.S.C. section 360bbb-3(b)(1), unless the authorization is terminated or revoked.  Performed at [facility], [address], [place], [place] [address]   Culture, blood (routine x 2)     Status: None (Preliminary result)   Collection Time: 04/13/21  7:18 AM   Specimen: BLOOD  Result Value Ref Range Status   Specimen  Description BLOOD RIGHT ANTECUBITAL  Final   Special Requests   Final    BOTTLES DRAWN AEROBIC AND ANAEROBIC Blood Culture results may not be optimal due to an inadequate volume of blood received in culture bottles   Culture   Final    NO GROWTH 2 DAYS Performed at [facility], [address] [place], [place] [address]    Report Status PENDING  Incomplete  Culture, blood (routine x 2)     Status: None (Preliminary result)   Collection Time: 04/13/21  7:18 AM   Specimen: BLOOD RIGHT ARM  Result Value Ref Range Status   Specimen Description BLOOD RIGHT ARM  Final   Special Requests   Final    BOTTLES DRAWN AEROBIC AND ANAEROBIC Blood Culture adequate volume   Culture   Final    NO GROWTH 2 DAYS Performed at [facility], [address] [place], [place] [address]    Report Status PENDING  Incomplete  Aerobic/Anaerobic Culture w Gram Stain (surgical/deep wound)     Status: None (Preliminary result)   Collection Time: 04/13/21 10:52 AM   Specimen: PATH Soft tissue  Result Value Ref Range Status   Specimen Description ABSCESS RIGHT NECK  Final   Special Requests NONE  Final   Gram Stain   Final    MODERATE WBC PRESENT, PREDOMINANTLY PMN NO ORGANISMS SEEN    Culture   Final    NO GROWTH 2 DAYS NO ANAEROBES ISOLATED; CULTURE IN PROGRESS FOR 5 DAYS Performed at [facility], [address] [place], [place] [address]    Report Status PENDING  Incomplete         Radiology Studies: No results found.      Scheduled Meds:  enoxaparin (LOVENOX) injection  40 mg Subcutaneous Q24H   sodium chloride flush  3 mL Intravenous Q12H   Continuous Infusions:  sodium chloride     clindamycin (CLEOCIN) IV 600 mg (04/15/21 0457)     LOS: 2 days    Time spent: 35 minutes    Montrice Montuori G Kartik Fernando, MD [facility]  If 7PM-7AM, please contact night-coverage [email or web address] Password [email or web address] 04/15/2021, 2:46 PM

## 2021-04-16 LAB — CBC WITH DIFFERENTIAL/PLATELET
Abs Immature Granulocytes: 0.2 10*3/uL — ABNORMAL HIGH (ref 0.00–0.07)
Basophils Absolute: 0 10*3/uL (ref 0.0–0.1)
Basophils Relative: 0 %
Eosinophils Absolute: 0.1 10*3/uL (ref 0.0–0.5)
Eosinophils Relative: 2 %
HCT: 35 % — ABNORMAL LOW (ref 39.0–52.0)
Hemoglobin: 10.9 g/dL — ABNORMAL LOW (ref 13.0–17.0)
Immature Granulocytes: 4 %
Lymphocytes Relative: 34 %
Lymphs Abs: 1.8 10*3/uL (ref 0.7–4.0)
MCH: 22.5 pg — ABNORMAL LOW (ref 26.0–34.0)
MCHC: 31.1 g/dL (ref 30.0–36.0)
MCV: 72.2 fL — ABNORMAL LOW (ref 80.0–100.0)
Monocytes Absolute: 0.4 10*3/uL (ref 0.1–1.0)
Monocytes Relative: 8 %
Neutro Abs: 2.7 10*3/uL (ref 1.7–7.7)
Neutrophils Relative %: 52 %
Platelets: 847 10*3/uL — ABNORMAL HIGH (ref 150–400)
RBC: 4.85 MIL/uL (ref 4.22–5.81)
RDW: 14.5 % (ref 11.5–15.5)
WBC: 5.3 10*3/uL (ref 4.0–10.5)
nRBC: 0 % (ref 0.0–0.2)

## 2021-04-16 LAB — BASIC METABOLIC PANEL
Anion gap: 7 (ref 5–15)
BUN: 10 mg/dL (ref 6–20)
CO2: 30 mmol/L (ref 22–32)
Calcium: 9.1 mg/dL (ref 8.9–10.3)
Chloride: 98 mmol/L (ref 98–111)
Creatinine, Ser: 0.82 mg/dL (ref 0.61–1.24)
GFR, Estimated: >60 mL/min (ref >60)
Glucose, Bld: 129 mg/dL — ABNORMAL HIGH (ref 70–99)
Potassium: 3.8 mmol/L (ref 3.5–5.1)
Sodium: 135 mmol/L (ref 135–145)

## 2021-04-16 LAB — PATHOLOGIST SMEAR REVIEW

## 2021-04-16 NOTE — Discharge Summary (Signed)
Physician Discharge Summary  Ernest Jenkins ZOX:[id number] DOB: 27-Sep-1980 DOA: 04/12/2021  PCP: Default, Provider, MD  Admit date: 04/12/2021 Discharge date: 04/16/2021 30 Day Unplanned Readmission Risk Score    Flowsheet Row ED to Hosp-Admission (Current) from 04/12/2021 in [facility]  30 Day Unplanned Readmission Risk Score (%) 11.63 Filed at 04/16/2021 0801       This score is the patient's risk of an unplanned readmission within 30 days of being discharged (0 -100%). The score is based on dignosis, age, lab data, medications, orders, and past utilization.   Low:  0-14.9   Medium: 15-21.9   High: 22-29.9   Extreme: 30 and above          Admitted From: Home Disposition: Home  Recommendations for Outpatient Follow-up:  Follow up with PCP in 1-2 weeks Please obtain BMP/CBC in one week Follow-up with ENT/Dr. Suszanne Conners in 2 weeks Please follow up with your PCP on the following pending results: Unresulted Labs (From admission, onward)    None         Home Health: None Equipment/Devices: None  Discharge Condition: Stable CODE STATUS: Full code Diet recommendation: Regular  Subjective: Seen and examined.  He is feeling much better, no pain and no problem with the swallowing.  Ready to go home.  Brief/Interim Summary: 40 years old male with past medical history of alcohol abuse recently discharged on 04/11/2021 for right lateral neck abscess status post incision and drainage, return to ED with a chief complaint of worsening of the neck pain and abscess and felt like was more swollen and had difficulty breathing.  He was already taking clindamycin.  He was placed on IV clindamycin and ENT was consulted.  He underwent incision and drainage once again.  Postoperatively, he continued to do well.  His pain is now much better.  He is not having any shortness of breath or any problems swallowing.  He was reassessed by ENT today and they have cleared him to go home with  instructions to continue his previous prescription of clindamycin until he runs out, that was prescribed to him at discharge.  ENT will follow up with him in 2 weeks.  He is being discharged in stable condition.  Discharge Diagnoses:  Principal Problem:   Neck abscess Active Problems:   Hyponatremia   Microcytic anemia   Thrombocytosis   Elevated AST (SGOT)    Discharge Instructions   Allergies as of 04/16/2021       Reactions   Penicillins Rash        Medication List     TAKE these medications    CertaVite/Antioxidants Tabs Take 1 tablet by mouth daily.   clindamycin 300 MG capsule Commonly known as: CLEOCIN Take 1 capsule (300 mg total) by mouth 4 (four) times daily for 10 days.   folic acid 1 MG tablet Commonly known as: FOLVITE Take 1 tablet (1 mg total) by mouth daily.   oxyCODONE 5 MG immediate release tablet Commonly known as: Oxy IR/ROXICODONE Take 1 tablet (5 mg total) by mouth every 6 (six) hours as needed for severe pain or breakthrough pain.   Senexon-S 8.6-50 MG tablet Generic drug: senna-docusate Take 1 tablet by mouth 2 (two) times daily.   thiamine 100 MG tablet Take 1 tablet (100 mg total) by mouth daily.        Allergies  Allergen Reactions   Penicillins Rash    Consultations: ENT   Procedures/Studies: CT Soft Tissue Neck W Contrast  Result Date: 04/12/2021 CLINICAL DATA:  Neck abscess EXAM: CT NECK WITH CONTRAST TECHNIQUE: Multidetector CT imaging of the neck was performed using the standard protocol following the bolus administration of intravenous contrast. CONTRAST:  75mL OMNIPAQUE IOHEXOL 300 MG/ML  SOLN COMPARISON:  CT neck 04/05/2021 FINDINGS: Pharynx and larynx: The nasal cavity and nasopharynx are unremarkable. There is mucosal edema in the right oropharynx extending into the vallecula and hypopharynx/larynx. There is effacement of the right piriform sinus. This is slightly increased since the study from 04/05/2021. The vocal  folds are unremarkable. Salivary glands: The right parotid gland again appears anteriorly displaced by the below described large collection in the right neck which is again felt unlikely to be parotid in origin. The right submandibular gland is also slightly anteriorly displaced. The left parotid and submandibular glands are normal. Thyroid: Normal. Lymph nodes: There is no pathologic lymphadenopathy in the neck. Vascular: The right internal jugular vein is again not identified. The vasculature is otherwise unremarkable. Limited intracranial: The imaged portions of the intracranial compartment are unremarkable. Visualized orbits: Not evaluated. Mastoids and visualized paranasal sinuses: The imaged paranasal sinuses are clear. The imaged mastoid air cells are clear. Skeleton: There is no acute osseous abnormality or aggressive osseous lesion. Upper chest: The imaged lung apices are clear. Other: Again seen is a large peripherally enhancing fluid collection in the right neck centered at the level of the hyoid bone. The collection measures up to approximately 7.9 cm AP by 6.4 cm TV by 8.1 cm cc. Size comparison to the prior study is difficult due to the irregularity of the collection, but overall the size appears slightly increased. The collection extends from posterior to the angle of the mandible superiorly into the sternocleidomastoid at the level of the thyroid inferiorly. Anteriorly, the collection abuts the submandibular gland. Posteriorly, the lesion extends to abut the paraspinal musculature. There is medial extent towards the right crus of the hyoid bone which appears slightly increased in extent from the prior study, and as above, edema in the adjacent pharyngeal mucosa is increased. There is significant inflammatory change in the surrounding soft tissues of the neck. IMPRESSION: Interval increase in size of the large peripherally enhancing collection in the right neck again consistent with a large abscess  given recent drainage of purulent material. There is slightly increased medial extent at the level of the hyoid bone with increased edema in the oropharynx extending into the vallecula with new effacement of the right piriform sinus. Electronically Signed   By: Lesia Hausen M.D.   On: 04/12/2021 14:48   CT Soft Tissue Neck W Contrast  Result Date: 04/05/2021 CLINICAL DATA:  Right neck mass for 1 week.  Fever and leukocytosis. EXAM: CT NECK WITH CONTRAST TECHNIQUE: Multidetector CT imaging of the neck was performed using the standard protocol following the bolus administration of intravenous contrast. CONTRAST:  75mL OMNIPAQUE IOHEXOL 300 MG/ML  SOLN COMPARISON:  None. FINDINGS: Pharynx and larynx: No mass. Calcification along the anterior aspect of the right palatine tonsil. No retropharyngeal fluid collection. Widely patent airway. Salivary glands: Mild mass effect on the right parotid and right submandibular glands by the large right neck mass/collection described below which is not felt to be of primary salivary origin. Unremarkable appearance of the left submandibular and left parotid glands. Thyroid: Subcentimeter calcification in the left thyroid lobe for which no imaging follow-up is recommended. Lymph nodes: Subcentimeter short axis lymph nodes in the neck bilaterally. Vascular: Nonvisualization of the right internal jugular vein in  the mid and upper neck, either completely effaced or occluded. Limited intracranial: Unremarkable. Visualized orbits: Unremarkable. Mastoids and visualized paranasal sinuses: Minimal mucosal thickening in the maxillary sinuses. Clear mastoid air cells. Skeleton: Scattered dental caries and periapical lucencies. Moderate disc degeneration at C5-6. Upper chest: Clear lung apices. Other: A predominantly low-density mass/collection in the lateral right upper neck with thick, irregular peripheral enhancement measures 5.5 x 5.6 x 7.4 cm. The mass is inseparable from the right  sternocleidomastoid muscle, and there is deep extension towards the parapharyngeal space. There is extensive surrounding inflammatory stranding which extends into the lower neck. IMPRESSION: 7.4 cm right upper neck mass with extensive surrounding inflammation most consistent with an abscess with this clinical history. Necrotic tumor is an alternative consideration. Electronically Signed   By: Sebastian Ache M.D.   On: 04/05/2021 16:26     Discharge Exam: Vitals:   04/16/21 0503 04/16/21 0914  BP: 114/74 118/75  Pulse: (!) 56 68  Resp: 16 18  Temp: 98.4 F (36.9 C) 98.1 F (36.7 C)  SpO2: 100% 100%   Vitals:   04/15/21 1646 04/15/21 2029 04/16/21 0503 04/16/21 0914  BP: 119/85 113/70 114/74 118/75  Pulse: 73 74 (!) 56 68  Resp: 16 16 16 18   Temp: 98.5 F (36.9 C) 97.9 F (36.6 C) 98.4 F (36.9 C) 98.1 F (36.7 C)  TempSrc: Oral Oral Oral Oral  SpO2: 100% 100% 100% 100%  Weight:      Height:        General: Pt is alert, awake, not in acute distress Cardiovascular: RRR, S1/S2 +, no rubs, no gallops Respiratory: CTA bilaterally, no wheezing, no rhonchi Abdominal: Soft, NT, ND, bowel sounds + Extremities: no edema, no cyanosis    The results of significant diagnostics from this hospitalization (including imaging, microbiology, ancillary and laboratory) are listed below for reference.     Microbiology: Recent Results (from the past 240 hour(s))  Resp Panel by RT-PCR (Flu A&B, Covid) Nasopharyngeal Swab     Status: None   Collection Time: 04/13/21  4:02 AM   Specimen: Nasopharyngeal Swab; Nasopharyngeal(NP) swabs in vial transport medium  Result Value Ref Range Status   SARS Coronavirus 2 by RT PCR NEGATIVE NEGATIVE Final    Comment: (NOTE) SARS-CoV-2 target nucleic acids are NOT DETECTED.  The SARS-CoV-2 RNA is generally detectable in upper respiratory specimens during the acute phase of infection. The lowest concentration of SARS-CoV-2 viral copies this assay can detect  is 138 copies/mL. A negative result does not preclude SARS-Cov-2 infection and should not be used as the sole basis for treatment or other patient management decisions. A negative result may occur with  improper specimen collection/handling, submission of specimen other than nasopharyngeal swab, presence of viral mutation(s) within the areas targeted by this assay, and inadequate number of viral copies(<138 copies/mL). A negative result must be combined with clinical observations, patient history, and epidemiological information. The expected result is Negative.  Fact Sheet for Patients:  14/02/22  Fact Sheet for Healthcare Providers:  BloggerCourse.com  This test is no t yet approved or cleared by the [employer].[place] FDA and  has been authorized for detection and/or diagnosis of SARS-CoV-2 by FDA under an Emergency Use Authorization (EUA). This EUA will remain  in effect (meaning this test can be used) for the duration of the COVID-19 declaration under Section 564(b)(1) of the Act, 21 U.S.C.section 360bbb-3(b)(1), unless the authorization is terminated  or revoked sooner.       Influenza A by  PCR NEGATIVE NEGATIVE Final   Influenza B by PCR NEGATIVE NEGATIVE Final    Comment: (NOTE) The Xpert Xpress SARS-CoV-2/FLU/RSV plus assay is intended as an aid in the diagnosis of influenza from Nasopharyngeal swab specimens and should not be used as a sole basis for treatment. Nasal washings and aspirates are unacceptable for Xpert Xpress SARS-CoV-2/FLU/RSV testing.  Fact Sheet for Patients: BloggerCourse.com  Fact Sheet for Healthcare Providers: SeriousBroker.it  This test is not yet approved or cleared by the [place] FDA and has been authorized for detection and/or diagnosis of SARS-CoV-2 by FDA under an Emergency Use Authorization (EUA). This EUA will remain in effect  (meaning this test can be used) for the duration of the COVID-19 declaration under Section 564(b)(1) of the Act, 21 U.S.C. section 360bbb-3(b)(1), unless the authorization is terminated or revoked.  Performed at [facility], [address] [place], [place] [address]   Culture, blood (routine x 2)     Status: None (Preliminary result)   Collection Time: 04/13/21  7:18 AM   Specimen: BLOOD  Result Value Ref Range Status   Specimen Description BLOOD RIGHT ANTECUBITAL  Final   Special Requests   Final    BOTTLES DRAWN AEROBIC AND ANAEROBIC Blood Culture results may not be optimal due to an inadequate volume of blood received in culture bottles   Culture   Final    NO GROWTH 3 DAYS Performed at [facility], [address], [place], [place] [address]    Report Status PENDING  Incomplete  Culture, blood (routine x 2)     Status: None (Preliminary result)   Collection Time: 04/13/21  7:18 AM   Specimen: BLOOD RIGHT ARM  Result Value Ref Range Status   Specimen Description BLOOD RIGHT ARM  Final   Special Requests   Final    BOTTLES DRAWN AEROBIC AND ANAEROBIC Blood Culture adequate volume   Culture   Final    NO GROWTH 3 DAYS Performed at [facility], [address], [place], [place] [address]    Report Status PENDING  Incomplete  Aerobic/Anaerobic Culture w Gram Stain (surgical/deep wound)     Status: None (Preliminary result)   Collection Time: 04/13/21 10:52 AM   Specimen: PATH Soft tissue  Result Value Ref Range Status   Specimen Description ABSCESS RIGHT NECK  Final   Special Requests NONE  Final   Gram Stain   Final    MODERATE WBC PRESENT, PREDOMINANTLY PMN NO ORGANISMS SEEN    Culture   Final    NO GROWTH 2 DAYS NO ANAEROBES ISOLATED; CULTURE IN PROGRESS FOR 5 DAYS Performed at [facility], [address] [place], [place] [address]    Report Status PENDING  Incomplete     Labs: BNP (last 3 results) No results for input(s): BNP in the  last 8760 hours. Basic Metabolic Panel: Recent Labs  Lab 04/11/21 0447 04/12/21 1401 04/13/21 0718 04/15/21 1455 04/16/21 0815  NA 129* 130* 131* 134* 135  K 4.5 3.7 4.5 4.1 3.8  CL 96* 95* 97* 96* 98  CO2 GLUCOSE 140* 107* 125* 89 129*  BUN 21* 10  CREATININE 0.83 0.82 0.72 0.93 0.82  CALCIUM 9.2 8.8* 9.2 9.0 9.1  MG 2.4  --   --   --   --   PHOS 3.9  --   --   --   --    Liver  Function Tests: Recent Labs  Lab 04/12/21 1401 04/13/21 0718 04/15/21 1455  AST 25 18 25   ALT 50* 39 38  ALKPHOS 51 60 62  BILITOT 0.5 0.3 0.3  PROT 6.6 7.0 6.3*  ALBUMIN 2.9* 2.9* 2.8*   No results for input(s): LIPASE, AMYLASE in the last 168 hours. No results for input(s): AMMONIA in the last 168 hours. CBC: Recent Labs  Lab 04/11/21 0447 04/12/21 1401 04/13/21 0718 04/15/21 1455 04/16/21 0815  WBC 20.5* 28.5* 27.5* 8.7 5.3  NEUTROABS  --  21.9*  --   --  2.7  HGB 10.8* 10.7* 11.2* 10.5* 10.9*  HCT 33.4* 34.6* 34.4* 33.0* 35.0*  MCV 70.2* 72.2* 70.2* 71.3* 72.2*  PLT 876* 937* 856* 771* 847*   Cardiac Enzymes: No results for input(s): CKTOTAL, CKMB, CKMBINDEX, TROPONINI in the last 168 hours. BNP: Invalid input(s): POCBNP CBG: Recent Labs  Lab 04/10/21 1120 04/10/21 1631 04/10/21 2111 04/11/21 0644 04/11/21 1147  GLUCAP 149* 189* 140* 135* 218*   D-Dimer No results for input(s): DDIMER in the last 72 hours. Hgb A1c No results for input(s): HGBA1C in the last 72 hours. Lipid Profile No results for input(s): CHOL, HDL, LDLCALC, TRIG, CHOLHDL, LDLDIRECT in the last 72 hours. Thyroid function studies No results for input(s): TSH, T4TOTAL, T3FREE, THYROIDAB in the last 72 hours.  Invalid input(s): FREET3 Anemia work up No results for input(s): VITAMINB12, FOLATE, FERRITIN, TIBC, IRON, RETICCTPCT in the last 72 hours. Urinalysis No results found for: COLORURINE, APPEARANCEUR, LABSPEC, PHURINE, GLUCOSEU, HGBUR, BILIRUBINUR, KETONESUR, PROTEINUR,  UROBILINOGEN, NITRITE, LEUKOCYTESUR Sepsis Labs Invalid input(s): PROCALCITONIN,  WBC,  LACTICIDVEN Microbiology Recent Results (from the past 240 hour(s))  Resp Panel by RT-PCR (Flu A&B, Covid) Nasopharyngeal Swab     Status: None   Collection Time: 04/13/21  4:02 AM   Specimen: Nasopharyngeal Swab; Nasopharyngeal(NP) swabs in vial transport medium  Result Value Ref Range Status   SARS Coronavirus 2 by RT PCR NEGATIVE NEGATIVE Final    Comment: (NOTE) SARS-CoV-2 target nucleic acids are NOT DETECTED.  The SARS-CoV-2 RNA is generally detectable in upper respiratory specimens during the acute phase of infection. The lowest concentration of SARS-CoV-2 viral copies this assay can detect is 138 copies/mL. A negative result does not preclude SARS-Cov-2 infection and should not be used as the sole basis for treatment or other patient management decisions. A negative result may occur with  improper specimen collection/handling, submission of specimen other than nasopharyngeal swab, presence of viral mutation(s) within the areas targeted by this assay, and inadequate number of viral copies(<138 copies/mL). A negative result must be combined with clinical observations, patient history, and epidemiological information. The expected result is Negative.  Fact Sheet for Patients:  14/02/22  Fact Sheet for Healthcare Providers:  BloggerCourse.com  This test is no t yet approved or cleared by the [employer] FDA and  has been authorized for detection and/or diagnosis of SARS-CoV-2 by FDA under an Emergency Use Authorization (EUA). This EUA will remain  in effect (meaning this test can be used) for the duration of the COVID-19 declaration under Section 564(b)(1) of the Act, 21 U.S.C.section 360bbb-3(b)(1), unless the authorization is terminated  or revoked sooner.       Influenza A by PCR NEGATIVE NEGATIVE Final   Influenza B by  PCR NEGATIVE NEGATIVE Final    Comment: (NOTE) The Xpert Xpress SARS-CoV-2/FLU/RSV plus assay is intended as an aid in the diagnosis of influenza from Nasopharyngeal swab specimens and should not be used as  a sole basis for treatment. Nasal washings and aspirates are unacceptable for Xpert Xpress SARS-CoV-2/FLU/RSV testing.  Fact Sheet for Patients: BloggerCourse.com  Fact Sheet for Healthcare Providers: SeriousBroker.it  This test is not yet approved or cleared by the [place] FDA and has been authorized for detection and/or diagnosis of SARS-CoV-2 by FDA under an Emergency Use Authorization (EUA). This EUA will remain in effect (meaning this test can be used) for the duration of the COVID-19 declaration under Section 564(b)(1) of the Act, 21 U.S.C. section 360bbb-3(b)(1), unless the authorization is terminated or revoked.  Performed at [facility], [address], [place], [place] [address]   Culture, blood (routine x 2)     Status: None (Preliminary result)   Collection Time: 04/13/21  7:18 AM   Specimen: BLOOD  Result Value Ref Range Status   Specimen Description BLOOD RIGHT ANTECUBITAL  Final   Special Requests   Final    BOTTLES DRAWN AEROBIC AND ANAEROBIC Blood Culture results may not be optimal due to an inadequate volume of blood received in culture bottles   Culture   Final    NO GROWTH 3 DAYS Performed at [facility], [address], [place], [place] [address]    Report Status PENDING  Incomplete  Culture, blood (routine x 2)     Status: None (Preliminary result)   Collection Time: 04/13/21  7:18 AM   Specimen: BLOOD RIGHT ARM  Result Value Ref Range Status   Specimen Description BLOOD RIGHT ARM  Final   Special Requests   Final    BOTTLES DRAWN AEROBIC AND ANAEROBIC Blood Culture adequate volume   Culture   Final    NO GROWTH 3 DAYS Performed at [facility], [address]  [place], [place] [address]    Report Status PENDING  Incomplete  Aerobic/Anaerobic Culture w Gram Stain (surgical/deep wound)     Status: None (Preliminary result)   Collection Time: 04/13/21 10:52 AM   Specimen: PATH Soft tissue  Result Value Ref Range Status   Specimen Description ABSCESS RIGHT NECK  Final   Special Requests NONE  Final   Gram Stain   Final    MODERATE WBC PRESENT, PREDOMINANTLY PMN NO ORGANISMS SEEN    Culture   Final    NO GROWTH 2 DAYS NO ANAEROBES ISOLATED; CULTURE IN PROGRESS FOR 5 DAYS Performed at [facility], [address] [place], [place] [address]    Report Status PENDING  Incomplete     Time coordinating discharge: Over 30 minutes  SIGNED:   Hughie Closs, MD  [facility] 04/16/2021, 11:01 AM  If 7PM-7AM, please contact night-coverage [email or web address]

## 2021-04-16 NOTE — Progress Notes (Signed)
Subjective: No issues overnight. Tolerated oral intake well. No breathing difficulty.  Objective: Vital signs in last 24 hours: Temp:  [97.9 F (36.6 C)-98.5 F (36.9 C)] 98.1 F (36.7 C) (12/05 0914) Pulse Rate:  [56-74] 68 (12/05 0914) Resp:  [16-18] 18 (12/05 0914) BP: (113-119)/(70-85) 118/75 (12/05 0914) SpO2:  [100 %] 100 % (12/05 0914)  Physical Exam: General appearance: alert, cooperative, and appears stated age Head: Normocephalic, without obvious abnormality, atraumatic Eyes: Pupils are equal, round, reactive to light. Extraocular motion is intact.  Ears: Examination of the ears shows normal auricles and external auditory canals bilaterally.   Nose: Nasal examination shows normal mucosa, septum, turbinates.  Face: Facial examination shows no asymmetry. Palpation of the face elicit no significant tenderness.  Mouth: Oral cavity examination shows no mucosal abnormalities. No significant trismus is noted.  Neck: Right neck swelling has significantly improved. Packing removed. Neuro: Cranial nerves 2-12 are all grossly in tact.   Recent Labs    04/15/21 1455 04/16/21 0815  WBC 8.7 5.3  HGB 10.5* 10.9*  HCT 33.0* 35.0*  PLT 771* 847*   Recent Labs    04/15/21 1455  NA 134*  K 4.1  CL 96*  CO2 28  GLUCOSE 89  BUN 21*  CREATININE 0.93  CALCIUM 9.0    Medications: I have reviewed the patient's current medications. Scheduled:  enoxaparin (LOVENOX) injection  40 mg Subcutaneous Q24H   sodium chloride flush  3 mL Intravenous Q12H   Continuous:  sodium chloride     clindamycin (CLEOCIN) IV 600 mg (04/16/21 0501)    Assessment/Plan: Right neck abscess, POD #3 s/p I&D - Significantly improved. - WBC has normalized - May d/c/ home. - Complete his current course of clindamycin (QID for 2 weeks) - Pt may follow up with me in 2 weeks.   LOS: 3 days   Tracey Hermance W Guillaume Weninger 04/16/2021, 10:14 AM

## 2021-04-16 NOTE — Progress Notes (Signed)
DISCHARGE NOTE HOME Ernest Jenkins to be discharged Home per MD order. Discussed prescriptions and follow up appointments with the patient. Prescriptions given to patient; medication list explained in detail. Patient verbalized understanding.  Skin clean, dry and intact without evidence of skin break down, no evidence of skin tears noted. IV catheter discontinued intact. Site without signs and symptoms of complications. Dressing and pressure applied. Pt denies pain at the site currently. No complaints noted.  Patient free of lines, drains, and wounds.   An After Visit Summary (AVS) was printed and given to the patient. Patient escorted via wheelchair, and discharged home via uber.  Yliana Gravois S Reynol Arnone, RN

## 2021-04-18 LAB — CULTURE, BLOOD (ROUTINE X 2)
Culture: NO GROWTH
Culture: NO GROWTH
Special Requests: ADEQUATE

## 2021-04-18 LAB — AEROBIC/ANAEROBIC CULTURE W GRAM STAIN (SURGICAL/DEEP WOUND)

## 2021-05-28 ENCOUNTER — Encounter: Payer: Self-pay | Admitting: Internal Medicine

## 2021-05-28 ENCOUNTER — Other Ambulatory Visit: Payer: Self-pay

## 2021-05-28 ENCOUNTER — Ambulatory Visit: Payer: Self-pay | Attending: Internal Medicine | Admitting: Internal Medicine

## 2021-05-28 DIAGNOSIS — D75839 Thrombocytosis, unspecified: Secondary | ICD-10-CM

## 2021-05-28 DIAGNOSIS — D509 Iron deficiency anemia, unspecified: Secondary | ICD-10-CM

## 2021-05-28 DIAGNOSIS — F1020 Alcohol dependence, uncomplicated: Secondary | ICD-10-CM | POA: Insufficient documentation

## 2021-05-28 DIAGNOSIS — Z7689 Persons encountering health services in other specified circumstances: Secondary | ICD-10-CM

## 2021-05-28 NOTE — Progress Notes (Signed)
Patient ID: Ernest Jenkins, male   DOB: June 25, 1980, 41 y.o.   MRN: [id number] Virtual Visit via Telephone Note  I connected with Ernest Jenkins on 05/28/2021 at 4:33 PM by telephone and verified that I am speaking with the correct person using two identifiers  Location: Patient: home Provider: office  Participants: Myself Patient   I discussed the limitations, risks, security and privacy concerns of performing an evaluation and management service by telephone and the availability of in person appointments. I also discussed with the patient that there may be a patient responsible charge related to this service. The patient expressed understanding and agreed to proceed.   History of Present Illness: Pt with hx of ETOH abuse, neck abscess. Today's visit is for hospital follow-up and new patient visit.  Patient reports not having a previous primary physician.  Patient was hospitalized the end of November with a large right lateral neck abscess.  He underwent drainage of the abscess by ENT and was placed on IV antibiotics.  He was noted to have chronic microcytic anemia with stable hemoglobin around 10.  He also had thrombocytosis.  Iron studies seem consistent with iron deficiency and anemia of chronic disease.  Patient rehospitalized 12/1-09/2020 with complaint of worsening of neck pain and swelling.  He underwent incision and drainage again by ENT.  Once stable he was discharged home on clindamycin.  Today: Patient reports that the abscess has completely resolved and he has completed the antibiotics.  In regards to alcohol use, patient reports that he was drinking 8-9 16 oz beers a day prior to hospitalization.  Since hospitalization he has cut down to no more than one 16 ounce beer a day sometimes none.  He admits to marijuana use daily but no other substances.  In regards to his anemia, patient reports he was never told that he was anemic.  He denies any history of sickle cell trait.  No blood in  the stools.  He has gained weight recently.   Outpatient Encounter Medications as of 05/28/2021  Medication Sig   folic acid (FOLVITE) 1 MG tablet Take 1 tablet (1 mg total) by mouth daily.   Multiple Vitamins-Minerals (CERTAVITE/ANTIOXIDANTS) TABS Take 1 tablet by mouth daily.   oxyCODONE (OXY IR/ROXICODONE) 5 MG immediate release tablet Take 1 tablet (5 mg total) by mouth every 6 (six) hours as needed for severe pain or breakthrough pain.   senna-docusate (SENOKOT-S) 8.6-50 MG tablet Take 1 tablet by mouth 2 (two) times daily.   thiamine 100 MG tablet Take 1 tablet (100 mg total) by mouth daily.   No facility-administered encounter medications on file as of 05/28/2021.      Observations/Objective: Lab Results  Component Value Date   WBC 5.3 04/16/2021   HGB 10.9 (L) 04/16/2021   HCT 35.0 (L) 04/16/2021   MCV 72.2 (L) 04/16/2021   PLT 847 (H) 04/16/2021     Chemistry      Component Value Date/Time   NA 135 04/16/2021 0815   K 3.8 04/16/2021 0815   CL 98 04/16/2021 0815   CO2 30 04/16/2021 0815   BUN 10 04/16/2021 0815   CREATININE 0.82 04/16/2021 0815      Component Value Date/Time   CALCIUM 9.1 04/16/2021 0815   ALKPHOS 62 04/15/2021 1455   AST 25 04/15/2021 1455   ALT 38 04/15/2021 1455   BILITOT 0.3 04/15/2021 1455     Lab Results  Component Value Date   IRON 18 (L) 04/05/2021   TIBC 239 (  L) 04/05/2021   FERRITIN 581 (H) 04/05/2021     Assessment and Plan: 1. Establishing care with new doctor, encounter for   2. Alcohol use disorder, moderate, dependence (HCC) Commended him on cutting back significantly.  Went over how much is too much alcohol for male in 1 day.  Encouraged him to quit completely.  3. Microcytic anemia 4. Thrombocytosis We will recheck iron studies and do a hemoglobin electrophoresis on in person visit.  Iron studies from the hospital look like combination of iron deficiency and anemia of chronic disease.  However ferritin was elevated at  the time likely due to inflammation that was going on from the abscess.  He is agreeable to coming in in several weeks to have physical done.   Follow Up Instructions: 4-6 wks for physical.   I discussed the assessment and treatment plan with the patient. The patient was provided an opportunity to ask questions and all were answered. The patient agreed with the plan and demonstrated an understanding of the instructions.   The patient was advised to call back or seek an in-person evaluation if the symptoms worsen or if the condition fails to improve as anticipated.  I  Spent 9 minutes on this telephone encounter  Jonah Blue, MD

## 2021-07-11 DIAGNOSIS — Z419 Encounter for procedure for purposes other than remedying health state, unspecified: Secondary | ICD-10-CM | POA: Diagnosis not present

## 2021-07-13 ENCOUNTER — Other Ambulatory Visit: Payer: Self-pay

## 2021-07-13 ENCOUNTER — Ambulatory Visit: Payer: Medicaid Other | Attending: Internal Medicine | Admitting: Internal Medicine

## 2021-07-13 ENCOUNTER — Encounter: Payer: Self-pay | Admitting: Internal Medicine

## 2021-07-13 VITALS — BP 123/78 | HR 78 | Resp 16 | Ht 75.0 in | Wt 185.8 lb

## 2021-07-13 DIAGNOSIS — H6123 Impacted cerumen, bilateral: Secondary | ICD-10-CM

## 2021-07-13 DIAGNOSIS — Z0001 Encounter for general adult medical examination with abnormal findings: Secondary | ICD-10-CM | POA: Diagnosis not present

## 2021-07-13 DIAGNOSIS — D75839 Thrombocytosis, unspecified: Secondary | ICD-10-CM | POA: Diagnosis not present

## 2021-07-13 DIAGNOSIS — D509 Iron deficiency anemia, unspecified: Secondary | ICD-10-CM

## 2021-07-13 DIAGNOSIS — Z1159 Encounter for screening for other viral diseases: Secondary | ICD-10-CM

## 2021-07-13 DIAGNOSIS — Z23 Encounter for immunization: Secondary | ICD-10-CM | POA: Diagnosis not present

## 2021-07-13 DIAGNOSIS — Z0000 Encounter for general adult medical examination without abnormal findings: Secondary | ICD-10-CM | POA: Diagnosis not present

## 2021-07-13 DIAGNOSIS — L729 Follicular cyst of the skin and subcutaneous tissue, unspecified: Secondary | ICD-10-CM

## 2021-07-13 DIAGNOSIS — K029 Dental caries, unspecified: Secondary | ICD-10-CM

## 2021-07-13 NOTE — Progress Notes (Addendum)
Patient ID: Ernest Jenkins, male    DOB: 1980/09/27  MRN: [id number]  CC: Annual Exam   Subjective: Ernest Jenkins is a 41 y.o. male who presents for annual exam. His concerns today include:   ETOH: Since our initial telephone visit about 6 weeks ago, patient states that he has cut back even more on the amount of beer that he drinks.  Now drinks 1 small 12 ounce can a day.      Microcytic anemia: We discussed the microcytic anemia and thrombocytosis on his last visit.  He reports fatigue sometimes.  No dizziness.  No blood in stools.  He did have iron studies done in the hospital but ferritin level was elevated likely due to inflammation/infection in his neck that was going on at that time.  We plan to recheck CBC and iron studies today.  Patient Active Problem List   Diagnosis Date Noted   Thrombocytosis 04/12/2021   Elevated AST (SGOT) 04/12/2021   Neck abscess 04/05/2021   Hyponatremia 04/05/2021   Microcytic anemia 04/05/2021     Current Outpatient Medications on File Prior to Visit  Medication Sig Dispense Refill   folic acid (FOLVITE) 1 MG tablet Take 1 tablet (1 mg total) by mouth daily. (Patient not taking: Reported on 07/13/2021) 30 tablet 0   Multiple Vitamins-Minerals (CERTAVITE/ANTIOXIDANTS) TABS Take 1 tablet by mouth daily. (Patient not taking: Reported on 07/13/2021) 30 tablet 0   oxyCODONE (OXY IR/ROXICODONE) 5 MG immediate release tablet Take 1 tablet (5 mg total) by mouth every 6 (six) hours as needed for severe pain or breakthrough pain. (Patient not taking: Reported on 07/13/2021) 14 tablet 0   senna-docusate (SENOKOT-S) 8.6-50 MG tablet Take 1 tablet by mouth 2 (two) times daily. (Patient not taking: Reported on 07/13/2021) 14 tablet 0   thiamine 100 MG tablet Take 1 tablet (100 mg total) by mouth daily. (Patient not taking: Reported on 07/13/2021) 30 tablet 0   No current facility-administered medications on file prior to visit.    Allergies  Allergen Reactions    Penicillins Rash    Social History   Socioeconomic History   Marital status: Single    Spouse name: Not on file   Number of children: 9   Years of education: Not on file   Highest education level: GED or equivalent  Occupational History   Occupation: unemployed  Tobacco Use   Smoking status: Never   Smokeless tobacco: Never  Vaping Use   Vaping Use: Never used  Substance and Sexual Activity   Alcohol use: Yes   Drug use: Yes    Types: Marijuana    Comment: daily   Sexual activity: Not on file  Other Topics Concern   Not on file  Social History Narrative   Not on file   Social Determinants of Health   Financial Resource Strain: Not on file  Food Insecurity: Not on file  Transportation Needs: Not on file  Physical Activity: Not on file  Stress: Not on file  Social Connections: Not on file  Intimate Partner Violence: Not on file    Family History  Problem Relation Age of Onset   Diabetes Maternal Aunt     Past Surgical History:  Procedure Laterality Date   MASS BIOPSY Right 04/13/2021   Procedure: Irrigation and Debridement Right Neck Abscess;  Surgeon: Newman Pies, MD;  Location: [facility] OR;  Service: ENT;  Laterality: Right;    ROS: Review of Systems  Constitutional:  Negative for appetite change  and fever.       Plays basketball almost daily  HENT:  Negative for hearing loss, sinus pressure, sore throat, trouble swallowing and voice change.   Eyes:  Negative for visual disturbance.       Last eye exam was over 2 yrs.  Respiratory:  Negative for chest tightness and shortness of breath.   Cardiovascular:  Negative for chest pain and leg swelling.       Reports occasional palpitations. No fainting or near fainting episodes  Gastrointestinal:  Negative for blood in stool.       Gets diarrhea off and on.  Genitourinary:  Negative for dysuria, hematuria and penile discharge.  Musculoskeletal:        Stiffness in the neck sometimes when he wakes in mornings.  Skin:   Negative for rash.  Neurological:  Negative for dizziness, seizures and light-headedness.  Psychiatric/Behavioral:  Negative for dysphoric mood. The patient is not nervous/anxious.     PHYSICAL EXAM: BP 123/78    Pulse 78    Resp 16    Ht 6\' 3"  (1.905 m)    Wt 185 lb 12.8 oz (84.3 kg)    SpO2 100%    BMI 23.22 kg/m   Physical Exam   General appearance - alert, well appearing, middle-age African-American male and in no distress Mental status - normal mood, behavior, speech, dress, motor activity, and thought processes Eyes - pupils equal and reactive, extraocular eye movements intact Ears -soft impacted cerumen in both ear canals obscuring view of the canal and tympanic membranes Nose - normal and patent, no erythema, discharge or polyps Mouth - mucous membranes moist, pharynx normal without lesions.  He has a decayed third molar in the left lower jaw that is broken off in the gum. Neck - supple, no significant adenopathy Lymphatics - no palpable lymphadenopathy, no hepatosplenomegaly Chest - clear to auscultation, no wheezes, rales or rhonchi, symmetric air entry Heart - normal rate, regular rhythm, normal S1, S2, no murmurs, rubs, clicks or gallops Abdomen - soft, nontender, nondistended, no masses or organomegaly Musculoskeletal - no joint tenderness, deformity or swelling Extremities - peripheral pulses normal, no pedal edema, no clubbing or cyanosis Skin -patient with tattoos on the arms.  He has a 1 cm soft movable subcutaneous cyst at the posterior hairline on the left side.  Patient states he has had this for a while and it has not changed in size.  CMP Latest Ref Rng & Units 04/16/2021 04/15/2021 04/13/2021  Glucose 70 - 99 mg/dL 814(G) 89 818(H)  BUN 6 - 20 mg/dL 10 63(J) 13  Creatinine 0.61 - 1.24 mg/dL 4.97 0.26 3.78  Sodium 135 - 145 mmol/L 135 134(L) 131(L)  Potassium 3.5 - 5.1 mmol/L 3.8 4.1 4.5  Chloride 98 - 111 mmol/L 98 96(L) 97(L)  CO2 22 - 32 mmol/L 30 28 26    Calcium 8.9 - 10.3 mg/dL 9.1 9.0 9.2  Total Protein 6.5 - 8.1 g/dL - 6.3(L) 7.0  Total Bilirubin 0.3 - 1.2 mg/dL - 0.3 0.3  Alkaline Phos 38 - 126 U/L - 62 60  AST 15 - 41 U/L - 25 18  ALT 0 - 44 U/L - 38 39   Lipid Panel     Component Value Date/Time   CHOL 210 (H) 07/13/2021 1556   TRIG 176 (H) 07/13/2021 1556   HDL 77 07/13/2021 1556   CHOLHDL 2.7 07/13/2021 1556   LDLCALC 103 (H) 07/13/2021 1556    CBC    Component  Value Date/Time   WBC 6.5 07/13/2021 1556   WBC 5.3 04/16/2021 0815   RBC 5.70 07/13/2021 1556   RBC 4.85 04/16/2021 0815   HGB 12.7 (L) 07/13/2021 1556   HCT 42.0 07/13/2021 1556   PLT 366 07/13/2021 1556   MCV 74 (L) 07/13/2021 1556   MCH 22.3 (L) 07/13/2021 1556   MCH 22.5 (L) 04/16/2021 0815   MCHC 30.2 (L) 07/13/2021 1556   MCHC 31.1 04/16/2021 0815   RDW 14.4 07/13/2021 1556   LYMPHSABS 1.8 04/16/2021 0815   MONOABS 0.4 04/16/2021 0815   EOSABS 0.1 04/16/2021 0815   BASOSABS 0.0 04/16/2021 0815    ASSESSMENT AND PLAN:  1. Annual physical exam Commended patient on being active.  Encouraged him to continue to stay active.  Discussed and encourage healthy eating habits. - Lipid panel  2. Dental decay Recommend that he schedule an appointment with a dentist for routine dental care and to have the decayed tooth extracted  3. Scalp cyst Observe.  Advised patient that if it changes in size or becomes painful we can refer him to a dermatologist to have it removed.  4. Microcytic anemia  - CBC - Iron, TIBC and Ferritin Panel  5. Thrombocytosis - CBC  6. Bilateral impacted cerumen Recommend purchasing some wax softener over-the-counter and using it for about 3 days.  He can return to have the ears flush afterwards  7. Need for immunization against influenza - Flu Vaccine QUAD 56mo+IM (Fluarix, Fluzone & Alfiuria Quad PF)  8. Need for Tdap vaccination Patient was agreeable to receiving Tdap today.  It was given.  9. Need for hepatitis C  screening test - Hepatitis C Antibody   Patient was given the opportunity to ask questions.  Patient verbalized understanding of the plan and was able to repeat key elements of the plan.   This documentation was completed using Paediatric nurse.  Any transcriptional errors are unintentional.  Orders Placed This Encounter  Procedures   Tdap vaccine greater than or equal to 7yo IM   Flu Vaccine QUAD 55mo+IM (Fluarix, Fluzone & Alfiuria Quad PF)   CBC   Lipid panel   Iron, TIBC and Ferritin Panel   Hepatitis C Antibody     Requested Prescriptions    No prescriptions requested or ordered in this encounter    Return if symptoms worsen or fail to improve.  Jonah Blue, MD, FACP

## 2021-07-13 NOTE — Patient Instructions (Signed)

## 2021-07-14 LAB — IRON,TIBC AND FERRITIN PANEL
Ferritin: 71 ng/mL (ref 30–400)
Iron Saturation: 26 % (ref 15–55)
Iron: 97 ug/dL (ref 38–169)
Total Iron Binding Capacity: 374 ug/dL (ref 250–450)
UIBC: 277 ug/dL (ref 111–343)

## 2021-07-14 LAB — CBC
Hematocrit: 42 % (ref 37.5–51.0)
Hemoglobin: 12.7 g/dL — ABNORMAL LOW (ref 13.0–17.7)
MCH: 22.3 pg — ABNORMAL LOW (ref 26.6–33.0)
MCHC: 30.2 g/dL — ABNORMAL LOW (ref 31.5–35.7)
MCV: 74 fL — ABNORMAL LOW (ref 79–97)
Platelets: 366 10*3/uL (ref 150–450)
RBC: 5.7 x10E6/uL (ref 4.14–5.80)
RDW: 14.4 % (ref 11.6–15.4)
WBC: 6.5 10*3/uL (ref 3.4–10.8)

## 2021-07-14 LAB — LIPID PANEL
Chol/HDL Ratio: 2.7 ratio (ref 0.0–5.0)
Cholesterol, Total: 210 mg/dL — ABNORMAL HIGH (ref 100–199)
HDL: 77 mg/dL (ref >39)
LDL Chol Calc (NIH): 103 mg/dL — ABNORMAL HIGH (ref 0–99)
Triglycerides: 176 mg/dL — ABNORMAL HIGH (ref 0–149)
VLDL Cholesterol Cal: 30 mg/dL (ref 5–40)

## 2021-07-14 LAB — HEPATITIS C ANTIBODY: Hep C Virus Ab: NONREACTIVE

## 2021-07-15 ENCOUNTER — Other Ambulatory Visit: Payer: Self-pay | Admitting: Internal Medicine

## 2021-07-15 DIAGNOSIS — D509 Iron deficiency anemia, unspecified: Secondary | ICD-10-CM

## 2021-07-15 NOTE — Progress Notes (Signed)
Let patient know that the anemia has pretty much resolved compared to when he was hospitalized in December.  However I would like for him to return to the lab to be screened for sickle cell trait.  Iron level is okay. ?Cholesterol level mildly elevated at 103 with goal being less than 100.  Healthy eating habits and regular exercise will help to lower cholesterol.  Screen for hepatitis C is negative.

## 2021-07-16 ENCOUNTER — Other Ambulatory Visit: Payer: Self-pay

## 2021-07-16 DIAGNOSIS — D509 Iron deficiency anemia, unspecified: Secondary | ICD-10-CM

## 2021-07-18 ENCOUNTER — Telehealth: Payer: Self-pay

## 2021-07-18 LAB — HGB FRACTIONATION CASCADE
Hgb A2: 2.3 % (ref 1.8–3.2)
Hgb A: 97.7 % (ref 96.4–98.8)
Hgb F: 0 % (ref 0.0–2.0)
Hgb S: 0 %

## 2021-07-18 LAB — SPECIMEN STATUS REPORT

## 2021-07-18 NOTE — Telephone Encounter (Signed)
Contacted pt to go over lab results pt is aware and doesn't have any questions or concerns 

## 2021-08-11 DIAGNOSIS — Z419 Encounter for procedure for purposes other than remedying health state, unspecified: Secondary | ICD-10-CM | POA: Diagnosis not present

## 2021-09-10 DIAGNOSIS — Z419 Encounter for procedure for purposes other than remedying health state, unspecified: Secondary | ICD-10-CM | POA: Diagnosis not present

## 2021-10-11 DIAGNOSIS — Z419 Encounter for procedure for purposes other than remedying health state, unspecified: Secondary | ICD-10-CM | POA: Diagnosis not present

## 2021-12-11 DIAGNOSIS — Z419 Encounter for procedure for purposes other than remedying health state, unspecified: Secondary | ICD-10-CM | POA: Diagnosis not present

## 2022-01-11 DIAGNOSIS — Z419 Encounter for procedure for purposes other than remedying health state, unspecified: Secondary | ICD-10-CM | POA: Diagnosis not present

## 2022-02-10 DIAGNOSIS — Z419 Encounter for procedure for purposes other than remedying health state, unspecified: Secondary | ICD-10-CM | POA: Diagnosis not present

## 2022-03-13 DIAGNOSIS — Z419 Encounter for procedure for purposes other than remedying health state, unspecified: Secondary | ICD-10-CM | POA: Diagnosis not present

## 2022-04-12 DIAGNOSIS — Z419 Encounter for procedure for purposes other than remedying health state, unspecified: Secondary | ICD-10-CM | POA: Diagnosis not present

## 2022-05-13 DIAGNOSIS — Z419 Encounter for procedure for purposes other than remedying health state, unspecified: Secondary | ICD-10-CM | POA: Diagnosis not present

## 2022-06-13 DIAGNOSIS — Z419 Encounter for procedure for purposes other than remedying health state, unspecified: Secondary | ICD-10-CM | POA: Diagnosis not present

## 2022-07-12 DIAGNOSIS — Z419 Encounter for procedure for purposes other than remedying health state, unspecified: Secondary | ICD-10-CM | POA: Diagnosis not present

## 2022-08-12 DIAGNOSIS — Z419 Encounter for procedure for purposes other than remedying health state, unspecified: Secondary | ICD-10-CM | POA: Diagnosis not present

## 2022-09-11 DIAGNOSIS — Z419 Encounter for procedure for purposes other than remedying health state, unspecified: Secondary | ICD-10-CM | POA: Diagnosis not present

## 2022-09-19 ENCOUNTER — Telehealth: Payer: Self-pay

## 2022-09-19 NOTE — Telephone Encounter (Signed)
LVM for patient to call back. AS, CMA 

## 2022-10-12 DIAGNOSIS — Z419 Encounter for procedure for purposes other than remedying health state, unspecified: Secondary | ICD-10-CM | POA: Diagnosis not present

## 2022-11-11 DIAGNOSIS — Z419 Encounter for procedure for purposes other than remedying health state, unspecified: Secondary | ICD-10-CM | POA: Diagnosis not present

## 2022-12-12 DIAGNOSIS — Z419 Encounter for procedure for purposes other than remedying health state, unspecified: Secondary | ICD-10-CM | POA: Diagnosis not present

## 2023-01-12 DIAGNOSIS — Z419 Encounter for procedure for purposes other than remedying health state, unspecified: Secondary | ICD-10-CM | POA: Diagnosis not present

## 2023-02-11 DIAGNOSIS — Z419 Encounter for procedure for purposes other than remedying health state, unspecified: Secondary | ICD-10-CM | POA: Diagnosis not present

## 2023-03-14 DIAGNOSIS — Z419 Encounter for procedure for purposes other than remedying health state, unspecified: Secondary | ICD-10-CM | POA: Diagnosis not present

## 2023-04-13 DIAGNOSIS — Z419 Encounter for procedure for purposes other than remedying health state, unspecified: Secondary | ICD-10-CM | POA: Diagnosis not present

## 2023-05-14 DIAGNOSIS — Z419 Encounter for procedure for purposes other than remedying health state, unspecified: Secondary | ICD-10-CM | POA: Diagnosis not present

## 2023-06-14 DIAGNOSIS — Z419 Encounter for procedure for purposes other than remedying health state, unspecified: Secondary | ICD-10-CM | POA: Diagnosis not present

## 2023-07-12 DIAGNOSIS — Z419 Encounter for procedure for purposes other than remedying health state, unspecified: Secondary | ICD-10-CM | POA: Diagnosis not present

## 2023-08-23 DIAGNOSIS — Z419 Encounter for procedure for purposes other than remedying health state, unspecified: Secondary | ICD-10-CM | POA: Diagnosis not present

## 2023-09-22 DIAGNOSIS — Z419 Encounter for procedure for purposes other than remedying health state, unspecified: Secondary | ICD-10-CM | POA: Diagnosis not present

## 2023-10-23 DIAGNOSIS — Z419 Encounter for procedure for purposes other than remedying health state, unspecified: Secondary | ICD-10-CM | POA: Diagnosis not present

## 2023-11-12 IMAGING — CT CT NECK W/ CM
4 of 5 series · 15 of 33 positions shown, 17 images · IV contrast (Omni 300)
Comparison: CT neck 04/05/2021

CLINICAL DATA: Neck abscess

EXAM:
CT NECK WITH CONTRAST
TECHNIQUE: Multidetector CT imaging of the neck was performed using the
standard protocol following the bolus administration of intravenous
contrast.
CONTRAST:  75mL OMNIPAQUE IOHEXOL 300 MG/ML  SOLN

[Series 3: neck 2.0 st · axial · 0.54mm/px · z∈[-284,-144]mm · 3 of 142 slices shown]
[im 36/142  bone]
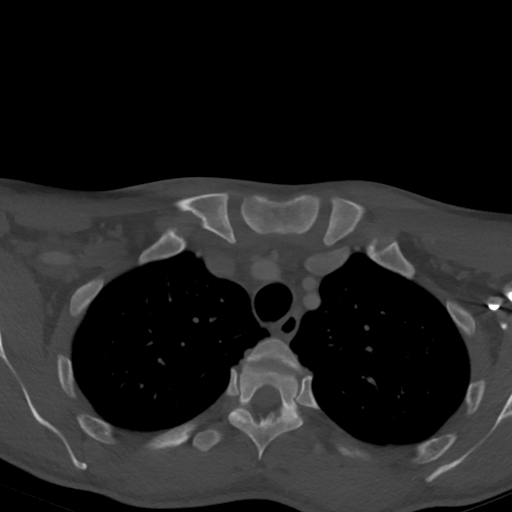
[im 71/142  bone]
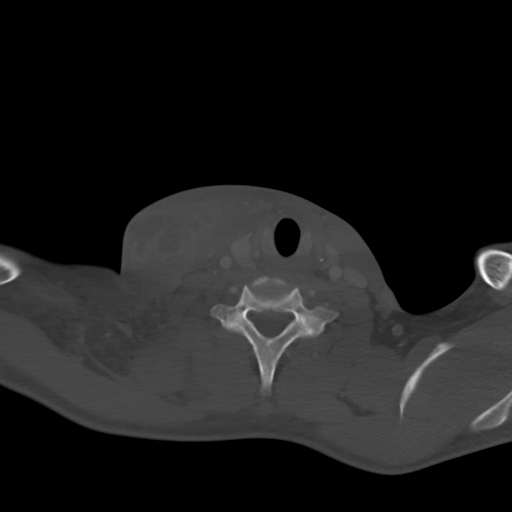
[im 106/142  bone]
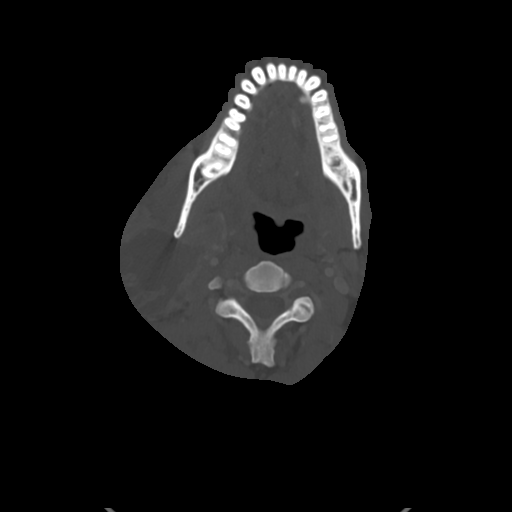

[Series 5: sagittal · sagittal · 0.55mm/px · 5 of 101 slices shown, 6 images]
[im 34/101  bone]
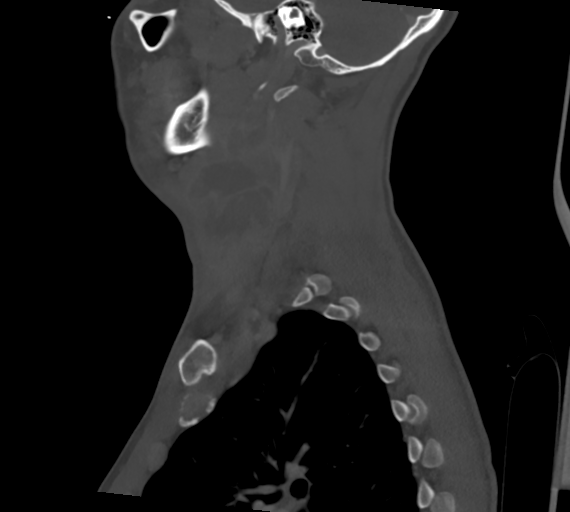
[im 42/101  bone]
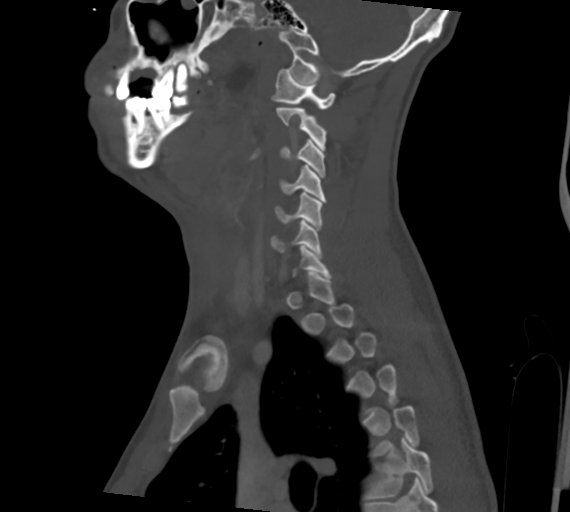
[im 51/101  soft-tissue]
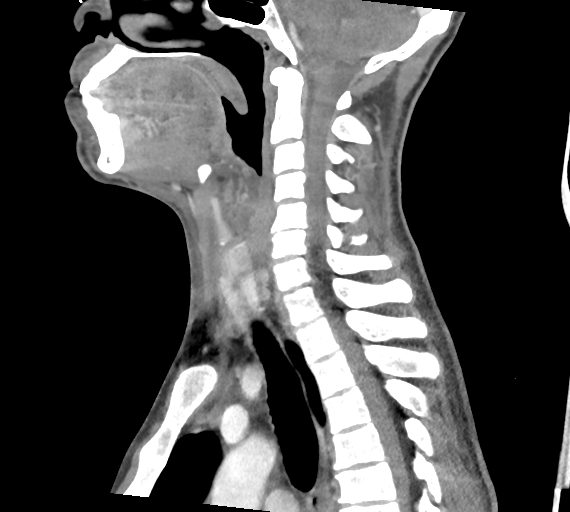
[im 51/101  bone]
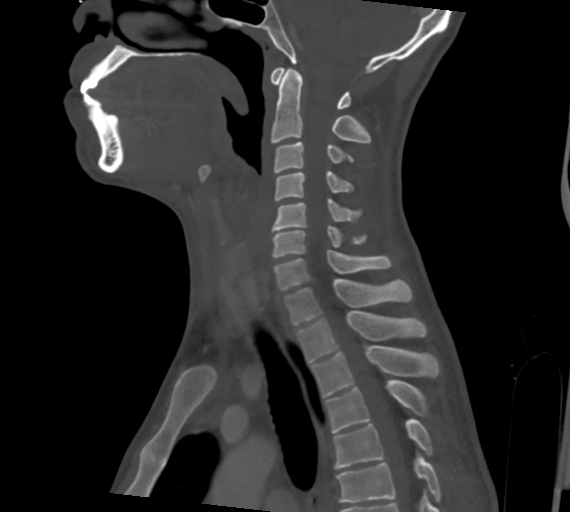
[im 59/101  bone]
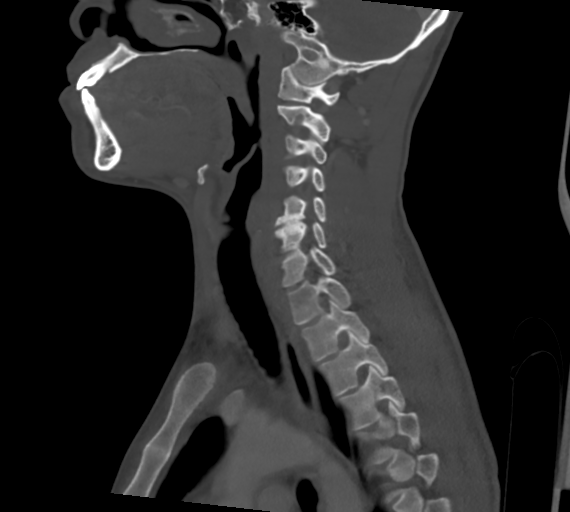
[im 67/101  bone]
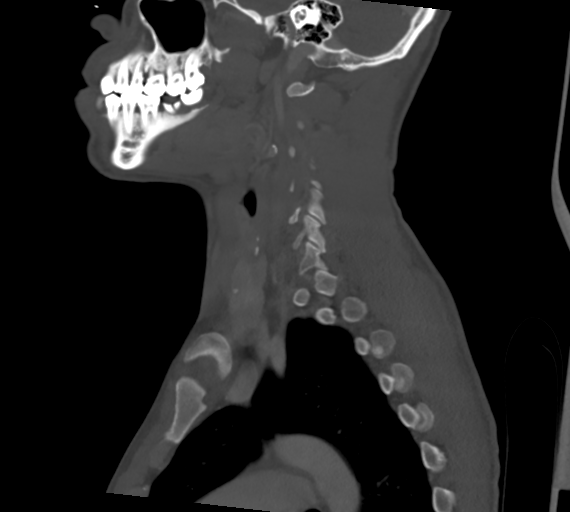

[Series 6: coronal · coronal · 0.55mm/px · 3 of 101 slices shown]
[im 21/101  bone]
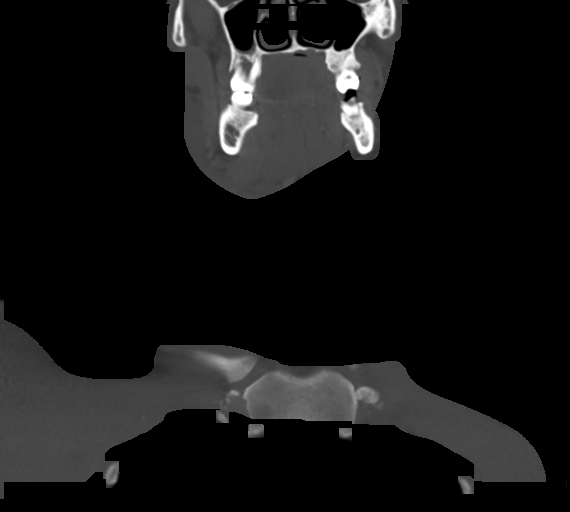
[im 41/101  bone]
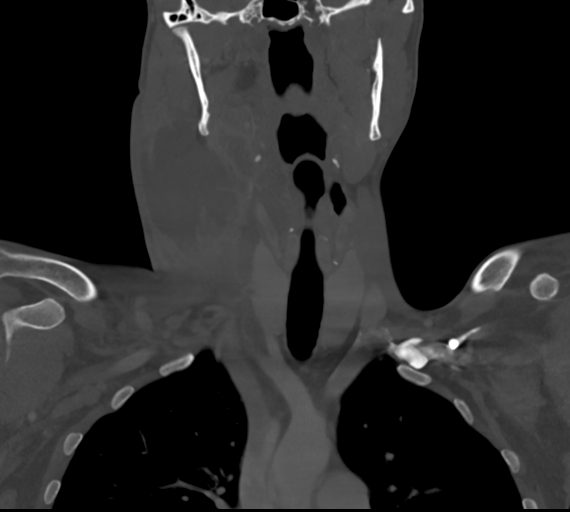
[im 61/101  bone]
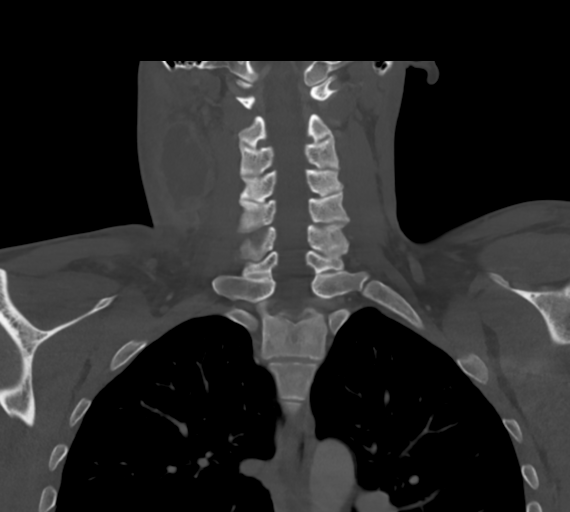

[Series 7: orthogonal · axial · 0.46mm/px · z∈[-311,-144]mm · 4 of 141 slices shown, 5 images]
[im 29/141  soft-tissue]
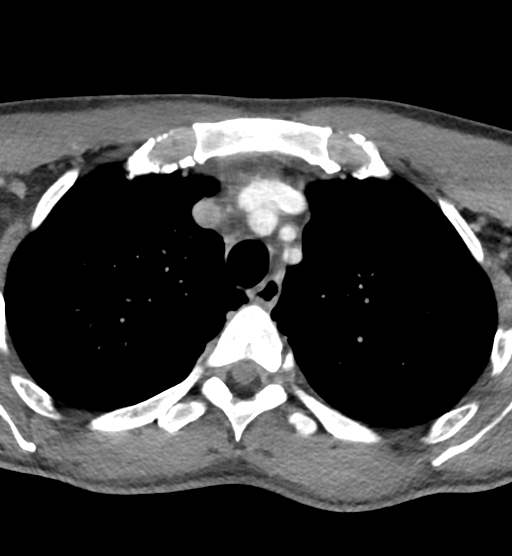
[im 29/141  bone]
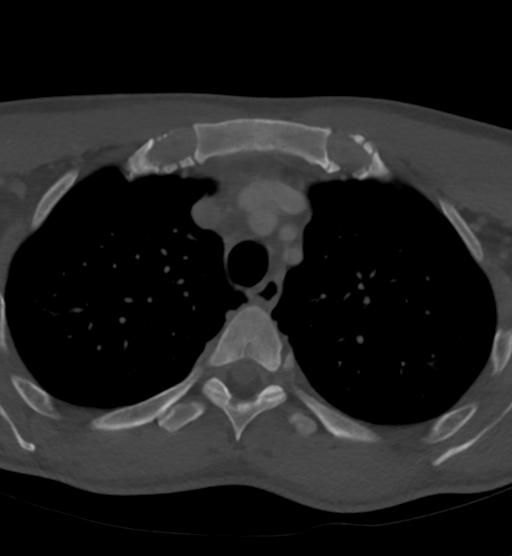
[im 57/141  bone]
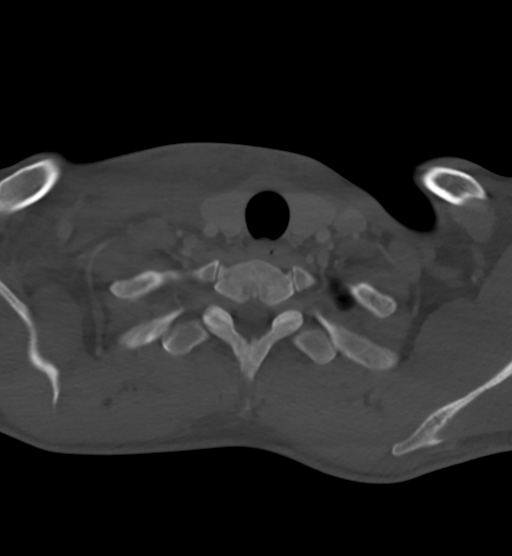
[im 85/141  bone]
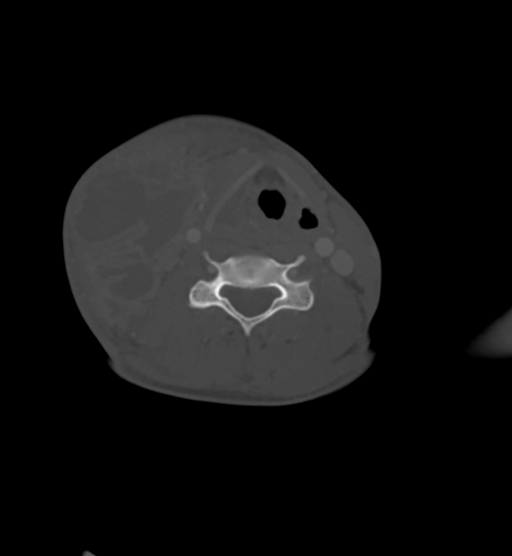
[im 113/141  bone]
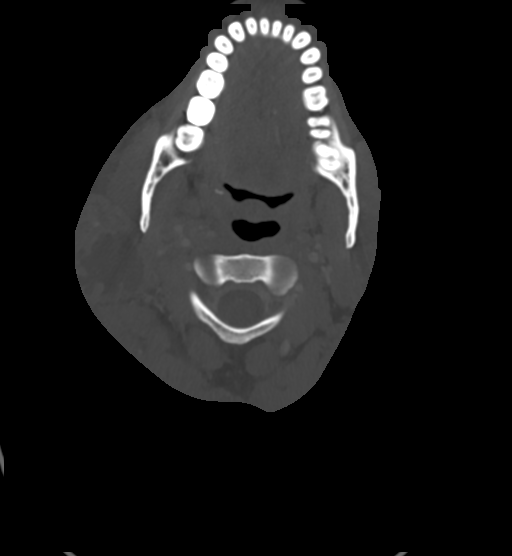

[15 of 33 positions shown; findings below may reference images not displayed]

FINDINGS: Pharynx and larynx: The nasal cavity and nasopharynx are
unremarkable.

There is mucosal edema in the right oropharynx extending into the
vallecula and hypopharynx/larynx. There is effacement of the right
piriform sinus. This is slightly increased since the study from
04/05/2021.

The vocal folds are unremarkable.

Salivary glands: The right parotid gland again appears anteriorly
displaced by the below described large collection in the right neck
which is again felt unlikely to be parotid in origin. The right
submandibular gland is also slightly anteriorly displaced. The left
parotid and submandibular glands are normal.

Thyroid: Normal.

Lymph nodes: There is no pathologic lymphadenopathy in the neck.

Vascular: The right internal jugular vein is again not identified.
The vasculature is otherwise unremarkable.

Limited intracranial: The imaged portions of the intracranial
compartment are unremarkable.

Visualized orbits: Not evaluated.

Mastoids and visualized paranasal sinuses: The imaged paranasal
sinuses are clear. The imaged mastoid air cells are clear.

Skeleton: There is no acute osseous abnormality or aggressive
osseous lesion.

Upper chest: The imaged lung apices are clear.

Other: Again seen is a large peripherally enhancing fluid collection
in the right neck centered at the level of the hyoid bone. The
collection measures up to approximately 7.9 cm AP by 6.4 cm TV by
8.1 cm cc. Size comparison to the prior study is difficult due to
the irregularity of the collection, but overall the size appears
slightly increased. The collection extends from posterior to the
angle of the mandible superiorly into the sternocleidomastoid at the
level of the thyroid inferiorly. Anteriorly, the collection abuts
the submandibular gland. Posteriorly, the lesion extends to abut the
paraspinal musculature. There is medial extent towards the right
crus of the hyoid bone which appears slightly increased in extent
from the prior study, and as above, edema in the adjacent pharyngeal
mucosa is increased.

There is significant inflammatory change in the surrounding soft
tissues of the neck.
IMPRESSION: Interval increase in size of the large peripherally enhancing
collection in the right neck again consistent with a large abscess
given recent drainage of purulent material. There is slightly
increased medial extent at the level of the hyoid bone with
increased edema in the oropharynx extending into the vallecula with
new effacement of the right piriform sinus.

## 2023-11-22 DIAGNOSIS — Z419 Encounter for procedure for purposes other than remedying health state, unspecified: Secondary | ICD-10-CM | POA: Diagnosis not present

## 2023-12-23 DIAGNOSIS — Z419 Encounter for procedure for purposes other than remedying health state, unspecified: Secondary | ICD-10-CM | POA: Diagnosis not present

## 2024-01-23 DIAGNOSIS — Z419 Encounter for procedure for purposes other than remedying health state, unspecified: Secondary | ICD-10-CM | POA: Diagnosis not present

## 2024-03-24 DIAGNOSIS — Z419 Encounter for procedure for purposes other than remedying health state, unspecified: Secondary | ICD-10-CM | POA: Diagnosis not present
